# Patient Record
Sex: Male | Born: 1940 | Race: White | Hispanic: No | Marital: Married | Smoking: Former smoker
Health system: Southern US, Community
[De-identification: ages and names within clinical notes are randomized; demographics above are authoritative.]

## PROBLEM LIST (undated history)

## (undated) DIAGNOSIS — E119 Type 2 diabetes mellitus without complications: Secondary | ICD-10-CM

## (undated) DIAGNOSIS — I2510 Atherosclerotic heart disease of native coronary artery without angina pectoris: Secondary | ICD-10-CM

## (undated) DIAGNOSIS — C140 Malignant neoplasm of pharynx, unspecified: Secondary | ICD-10-CM

## (undated) DIAGNOSIS — I350 Nonrheumatic aortic (valve) stenosis: Secondary | ICD-10-CM

## (undated) DIAGNOSIS — I779 Disorder of arteries and arterioles, unspecified: Secondary | ICD-10-CM

## (undated) DIAGNOSIS — I4892 Unspecified atrial flutter: Secondary | ICD-10-CM

## (undated) DIAGNOSIS — I5022 Chronic systolic (congestive) heart failure: Secondary | ICD-10-CM

## (undated) DIAGNOSIS — I10 Essential (primary) hypertension: Secondary | ICD-10-CM

## (undated) DIAGNOSIS — E785 Hyperlipidemia, unspecified: Secondary | ICD-10-CM

## (undated) DIAGNOSIS — G8929 Other chronic pain: Secondary | ICD-10-CM

## (undated) DIAGNOSIS — I739 Peripheral vascular disease, unspecified: Secondary | ICD-10-CM

## (undated) DIAGNOSIS — I639 Cerebral infarction, unspecified: Secondary | ICD-10-CM

## (undated) DIAGNOSIS — M549 Dorsalgia, unspecified: Secondary | ICD-10-CM

## (undated) HISTORY — PX: TRACHEOSTOMY: SUR1362

## (undated) HISTORY — DX: Chronic systolic (congestive) heart failure: I50.22

## (undated) HISTORY — PX: BYPASS GRAFT: SHX909

## (undated) HISTORY — DX: Nonrheumatic aortic (valve) stenosis: I35.0

## (undated) HISTORY — PX: CARDIAC CATHETERIZATION: SHX172

---

## 2006-03-08 ENCOUNTER — Inpatient Hospital Stay (HOSPITAL_COMMUNITY): Admission: RE | Admit: 2006-03-08 | Discharge: 2006-03-13 | Payer: Self-pay | Admitting: Cardiothoracic Surgery

## 2006-04-07 ENCOUNTER — Encounter: Admission: RE | Admit: 2006-04-07 | Discharge: 2006-04-07 | Payer: Self-pay | Admitting: Cardiothoracic Surgery

## 2006-11-01 ENCOUNTER — Emergency Department: Payer: Self-pay | Admitting: General Practice

## 2010-01-12 ENCOUNTER — Ambulatory Visit: Payer: Self-pay | Admitting: Cardiovascular Disease

## 2010-05-03 ENCOUNTER — Inpatient Hospital Stay: Payer: Self-pay | Admitting: Internal Medicine

## 2011-03-26 ENCOUNTER — Other Ambulatory Visit: Payer: Self-pay | Admitting: *Deleted

## 2011-09-15 ENCOUNTER — Ambulatory Visit: Payer: Self-pay | Admitting: Cardiovascular Disease

## 2011-10-07 ENCOUNTER — Inpatient Hospital Stay: Payer: Self-pay | Admitting: Vascular Surgery

## 2012-06-26 ENCOUNTER — Emergency Department: Payer: Self-pay | Admitting: Emergency Medicine

## 2012-06-26 LAB — TROPONIN I: Troponin-I: <0.02 ng/mL

## 2012-06-26 LAB — CBC
MCH: 33.1 pg (ref 26.0–34.0)
Platelet: 179 10*3/uL (ref 150–440)
RBC: 3.6 10*6/uL — ABNORMAL LOW (ref 4.40–5.90)
RDW: 12.8 % (ref 11.5–14.5)
WBC: 4.6 10*3/uL (ref 3.8–10.6)

## 2012-06-26 LAB — URINALYSIS, COMPLETE
Bacteria: NONE SEEN
Glucose,UR: 150 mg/dL (ref 0–75)
Ketone: NEGATIVE
Nitrite: NEGATIVE
Protein: NEGATIVE
RBC,UR: NONE SEEN /HPF (ref 0–5)
Specific Gravity: 1.004 (ref 1.003–1.030)
WBC UR: <1 /HPF (ref 0–5)

## 2012-06-26 LAB — ETHANOL
Ethanol %: 0.205 % — ABNORMAL HIGH (ref 0.000–0.080)
Ethanol: 205 mg/dL

## 2012-06-26 LAB — COMPREHENSIVE METABOLIC PANEL
Anion Gap: 9 (ref 7–16)
Calcium, Total: 9.1 mg/dL (ref 8.5–10.1)
Chloride: 87 mmol/L — ABNORMAL LOW (ref 98–107)
Creatinine: 0.87 mg/dL (ref 0.60–1.30)
EGFR (African American): >60
Potassium: 3.4 mmol/L — ABNORMAL LOW (ref 3.5–5.1)
SGOT(AST): 81 U/L — ABNORMAL HIGH (ref 15–37)
SGPT (ALT): 55 U/L (ref 12–78)
Sodium: 124 mmol/L — ABNORMAL LOW (ref 136–145)

## 2012-06-26 LAB — DRUG SCREEN, URINE
Amphetamines, Ur Screen: NEGATIVE (ref ?–1000)
Cocaine Metabolite,Ur ~~LOC~~: NEGATIVE (ref ?–300)
MDMA (Ecstasy)Ur Screen: NEGATIVE (ref ?–500)
Opiate, Ur Screen: NEGATIVE (ref ?–300)
Tricyclic, Ur Screen: NEGATIVE (ref ?–1000)

## 2015-05-27 ENCOUNTER — Other Ambulatory Visit: Payer: Self-pay

## 2015-05-27 ENCOUNTER — Emergency Department: Payer: Medicare HMO

## 2015-05-27 ENCOUNTER — Encounter: Payer: Self-pay | Admitting: Emergency Medicine

## 2015-05-27 ENCOUNTER — Emergency Department
Admission: EM | Admit: 2015-05-27 | Discharge: 2015-05-27 | Disposition: A | Discharge status: Home / Self Care | Payer: Medicare HMO | Attending: Emergency Medicine | Admitting: Emergency Medicine

## 2015-05-27 DIAGNOSIS — I959 Hypotension, unspecified: Secondary | ICD-10-CM | POA: Diagnosis present

## 2015-05-27 DIAGNOSIS — E860 Dehydration: Secondary | ICD-10-CM

## 2015-05-27 DIAGNOSIS — E119 Type 2 diabetes mellitus without complications: Secondary | ICD-10-CM | POA: Diagnosis not present

## 2015-05-27 DIAGNOSIS — I10 Essential (primary) hypertension: Secondary | ICD-10-CM | POA: Insufficient documentation

## 2015-05-27 DIAGNOSIS — Z87891 Personal history of nicotine dependence: Secondary | ICD-10-CM | POA: Insufficient documentation

## 2015-05-27 HISTORY — DX: Essential (primary) hypertension: I10

## 2015-05-27 HISTORY — DX: Hyperlipidemia, unspecified: E78.5

## 2015-05-27 HISTORY — DX: Cerebral infarction, unspecified: I63.9

## 2015-05-27 HISTORY — DX: Other chronic pain: G89.29

## 2015-05-27 HISTORY — DX: Dorsalgia, unspecified: M54.9

## 2015-05-27 LAB — HEPATIC FUNCTION PANEL
ALBUMIN: 4.3 g/dL (ref 3.5–5.0)
ALT: 16 U/L — ABNORMAL LOW (ref 17–63)
AST: 25 U/L (ref 15–41)
Alkaline Phosphatase: 56 U/L (ref 38–126)
BILIRUBIN DIRECT: 0.1 mg/dL (ref 0.1–0.5)
BILIRUBIN INDIRECT: 0.6 mg/dL (ref 0.3–0.9)
Total Bilirubin: 0.7 mg/dL (ref 0.3–1.2)
Total Protein: 7.3 g/dL (ref 6.5–8.1)

## 2015-05-27 LAB — BASIC METABOLIC PANEL
ANION GAP: 14 (ref 5–15)
BUN: 17 mg/dL (ref 6–20)
CHLORIDE: 91 mmol/L — AB (ref 101–111)
CO2: 22 mmol/L (ref 22–32)
Calcium: 9.2 mg/dL (ref 8.9–10.3)
Creatinine, Ser: 1.77 mg/dL — ABNORMAL HIGH (ref 0.61–1.24)
GFR calc Af Amer: 42 mL/min — ABNORMAL LOW (ref >60)
GFR, EST NON AFRICAN AMERICAN: 36 mL/min — AB (ref >60)
GLUCOSE: 119 mg/dL — AB (ref 65–99)
POTASSIUM: 4.6 mmol/L (ref 3.5–5.1)
Sodium: 127 mmol/L — ABNORMAL LOW (ref 135–145)

## 2015-05-27 LAB — URINALYSIS COMPLETE WITH MICROSCOPIC (ARMC ONLY)
BACTERIA UA: NONE SEEN
Bilirubin Urine: NEGATIVE
Glucose, UA: NEGATIVE mg/dL
Hgb urine dipstick: NEGATIVE
Ketones, ur: NEGATIVE mg/dL
Leukocytes, UA: NEGATIVE
Nitrite: NEGATIVE
PROTEIN: NEGATIVE mg/dL
Specific Gravity, Urine: 1.012 (ref 1.005–1.030)
pH: 5 (ref 5.0–8.0)

## 2015-05-27 LAB — CBC
HEMATOCRIT: 35.7 % — AB (ref 40.0–52.0)
HEMOGLOBIN: 12.1 g/dL — AB (ref 13.0–18.0)
MCH: 32.4 pg (ref 26.0–34.0)
MCHC: 33.8 g/dL (ref 32.0–36.0)
MCV: 95.9 fL (ref 80.0–100.0)
PLATELETS: 199 10*3/uL (ref 150–440)
RBC: 3.72 MIL/uL — AB (ref 4.40–5.90)
RDW: 12.4 % (ref 11.5–14.5)
WBC: 11.4 10*3/uL — ABNORMAL HIGH (ref 3.8–10.6)

## 2015-05-27 LAB — LACTIC ACID, PLASMA
LACTIC ACID, VENOUS: 1.8 mmol/L (ref 0.5–2.0)
Lactic Acid, Venous: 3.1 mmol/L (ref 0.5–2.0)

## 2015-05-27 LAB — LIPASE, BLOOD: Lipase: 31 U/L (ref 22–51)

## 2015-05-27 LAB — TROPONIN I: Troponin I: <0.03 ng/mL (ref <0.031)

## 2015-05-27 MED ORDER — SODIUM CHLORIDE 0.9 % IV BOLUS (SEPSIS)
1000.0000 mL | Freq: Once | INTRAVENOUS | Status: AC
  Administered 2015-05-27: 1000 mL via INTRAVENOUS

## 2015-05-27 MED ORDER — ONDANSETRON 8 MG PO TBDP: 8.0000 mg | ORAL_TABLET | Freq: Three times a day (TID) | ORAL | Status: DC | PRN

## 2015-05-27 NOTE — ED Notes (Signed)
Dr. Joni Fears notified of elevated Lactic Acid level.

## 2015-05-27 NOTE — ED Provider Notes (Signed)
[facility] Emergency Department Provider Note  ____________________________________________  Time seen: 5:40 PM  I have reviewed the triage vital signs and the nursing notes.   HISTORY  Chief Complaint Hypotension    HPI Mitchell Huffman is a 74 y.o. male who is sent to the ED by his occupational therapy nurse who found the patient's blood pressure to be 80/43 at home. He was noted to be diaphoretic and pale at the time. He notes that up so last a few minutes and then he returned normal. The only symptom he can pinpoint is that he has not had an appetite and not been eating or drinking much over the last 2 days. Denies any chest pain shortness of breath fever chills headache nausea vomiting diarrhea abdominal pain back pain and neck pain vision changes numbness tingling or weakness.  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 He currently feels entirely at his baseline.     Past Medical History  Diagnosis Date  . Diabetes mellitus without complication   . Chronic back pain   . Hypertension   . Stroke   . Hyperlipidemia     There are no active problems to display for this patient.   Past Surgical History  Procedure Laterality Date  . Bypass graft    . Cardiac catheterization    . Tracheostomy      Current Outpatient Rx  Name  Route  Sig  Dispense  Refill  . ondansetron (ZOFRAN ODT) 8 MG disintegrating tablet   Oral   Take 1 tablet (8 mg total) by mouth every 8 (eight) hours as needed for nausea or vomiting.   20 tablet   0     Allergies Review of patient's allergies indicates no known allergies.  No family history on file.  Social History History  Substance Use Topics  . Smoking status: Former Research scientist (life sciences)  . Smokeless tobacco: Not on file  . Alcohol Use: Yes    Review of Systems  Constitutional: No fever or chills. No weight  changes Eyes:No blurry vision or double vision.  ENT: No sore throat. Cardiovascular: No chest pain. Respiratory: No dyspnea or cough. Gastrointestinal: Negative for abdominal pain, vomiting and diarrhea.  No BRBPR or melena. Loss of appetite Genitourinary: Negative for dysuria, urinary retention, bloody urine, or difficulty urinating. Musculoskeletal: Negative for back pain. No joint swelling or pain. Skin: Negative for rash. Neurological: Negative for headaches, focal weakness or numbness. Psychiatric:No anxiety or depression.   Endocrine:No hot/cold intolerance, changes in energy, or sleep difficulty.  10-point ROS otherwise negative.  ____________________________________________   PHYSICAL EXAM:  VITAL SIGNS: ED Triage Vitals  Enc Vitals Group     BP 05/27/15 1730 119/57 mmHg     Pulse Rate 05/27/15 1730 67     Resp 05/27/15 1730 20     Temp 05/27/15 1730 97.5 F (36.4 C)     Temp Source 05/27/15 1730 Oral     SpO2 05/27/15 1730 100 %     Weight 05/27/15 1730 170 lb (77.111 kg)     Height 05/27/15 1730 5\' 6"  (1.676 m)     Head Cir --      Peak Flow --      Pain Score --      Pain Loc --      Pain Edu? --      Excl. in [place]? --  Constitutional: Alert and oriented. Well appearing and in no distress. Eyes: No scleral icterus. No conjunctival pallor. PERRL. EOMI ENT   Head: Normocephalic and atraumatic.   Nose: No congestion/rhinnorhea. No septal hematoma   Mouth/Throat: Dry mucous membranes, no pharyngeal erythema. No peritonsillar mass. No uvula shift.   Neck: No stridor. No SubQ emphysema. No meningismus. Tracheostomy intact Hematological/Lymphatic/Immunilogical: No cervical lymphadenopathy. Cardiovascular: RRR. Normal and symmetric distal pulses are present in all extremities. No murmurs, rubs, or gallops. Respiratory: Normal respiratory effort without tachypnea nor retractions. Breath sounds are clear and equal bilaterally. No  wheezes/rales/rhonchi. Gastrointestinal: Soft and nontender. No distention. There is no CVA tenderness.  No rebound, rigidity, or guarding. Genitourinary: deferred Musculoskeletal: Nontender with normal range of motion in all extremities. No joint effusions.  No lower extremity tenderness.  No edema. Neurologic:   Unable to speak due to tracheostomy.  CN 2-10 normal. Motor grossly intact. No pronator drift.  Normal gait. No gross focal neurologic deficits are appreciated.  Skin:  Skin is warm, dry and intact. No rash noted.  No petechiae, purpura, or bullae. Psychiatric: Mood and affect are normal. Speech and behavior are normal. Patient exhibits appropriate insight and judgment.  ____________________________________________    LABS (pertinent positives/negatives) (all labs ordered are listed, but only abnormal results are displayed) Labs Reviewed  BASIC METABOLIC PANEL - Abnormal; Notable for the following:    Sodium 127 (*)    Chloride 91 (*)    Glucose, Bld 119 (*)    Creatinine, Ser 1.77 (*)    GFR calc non Af Amer 36 (*)    GFR calc Af Amer 42 (*)    All other components within normal limits  CBC - Abnormal; Notable for the following:    WBC 11.4 (*)    RBC 3.72 (*)    Hemoglobin 12.1 (*)    HCT 35.7 (*)    All other components within normal limits  HEPATIC FUNCTION PANEL - Abnormal; Notable for the following:    ALT 16 (*)    All other components within normal limits  LACTIC ACID, PLASMA - Abnormal; Notable for the following:    Lactic Acid, Venous 3.1 (*)    All other components within normal limits  URINALYSIS COMPLETEWITH MICROSCOPIC ([facility] ONLY) - Abnormal; Notable for the following:    Color, Urine YELLOW (*)    APPearance CLEAR (*)    Squamous Epithelial / LPF 0-5 (*)    All other components within normal limits  CULTURE, BLOOD (ROUTINE X 2)  CULTURE, BLOOD (ROUTINE X 2)  URINE CULTURE  LIPASE, BLOOD  TROPONIN I  LACTIC ACID, PLASMA  CBG MONITORING, ED    ____________________________________________   EKG  Interpreted by me Normal sinus rhythm rate of 83, normal axis and intervals. Small R-wave in V2 with downsloping inverted T-wave. There is also T-wave inversion in aVL. This is unchanged compared to prior EKG on 06/26/2012  Due to findings in V2 on initial EKG repeat EKGs were performed using right-sided and posterior leads. Right-sided EKG interpreted by me. Right-sided leads were placed on V4 through V6. EKG is unchanged with expected right-sided morphologies in V4 through V6 without ST elevation.  Posterior lead EKG interpreted by me Posterior leads were placed using the V4 and V5 cables corresponding to positions V8 and V9 No changes in EKG, posterior leads labeled as V4 and V5 have expected morphologies without any ST elevation.  ____________________________________________    RADIOLOGY  Chest x-ray unremarkable  ____________________________________________   PROCEDURES  CRITICAL CARE Performed by: Joni Fears, Drexel Ivey   Total critical care time: 35 minutes  Critical care time was exclusive of separately billable procedures and treating other patients.  Critical care was necessary to treat or prevent imminent or life-threatening deterioration.  Critical care was time spent personally by me on the following activities: development of treatment plan with patient and/or surrogate as well as nursing, discussions with consultants, evaluation of patient's response to treatment, examination of patient, obtaining history from patient or surrogate, ordering and performing treatments and interventions, ordering and review of laboratory studies, ordering and review of radiographic studies, pulse oximetry and re-evaluation of patient's condition.  ____________________________________________   INITIAL IMPRESSION / ASSESSMENT AND PLAN / ED COURSE  Pertinent labs & imaging results that were available during my care of the patient  were reviewed by me and considered in my medical decision making (see chart for details).  Patient currently and apparent baseline no acute distress nontoxic. Vital signs are currently normal, and EKG is unchanged. Due to the documented episode of hypotension, will give IV fluid bolus as well as check chest x-ray urinalysis labs including lactate and blood culture. We'll hold off and empiric antibiotics at this time since the patient has no acute symptoms and normal vitals. If workup is unremarkable we will discharge the patient home to follow up with primary care. ----------------------------------------- 7:57 PM on 05/27/2015 -----------------------------------------  Initial workup unremarkable except for acute renal insufficiency with a creatinine of 1.77 with a previous baseline of 0.87. He has has chronic hyponatremia that is stable, and a elevated lactate of 3.1. I discussed the son is with the patient which appear to be due to dehydration and poor by mouth intake over last couple days. He states that he will drink extra water be sure to stay hydrated. He refuses to stay in the hospital, so we'll continue IV fluids and check a repeat lactate at a 2 hour interval to ensure that he is improving with hydration. Chest x-ray and urinalysis are unremarkable, the patient remains completely asymptomatic and at baseline with normal vital signs throughout his stay in the ED. There is no evidence of sepsis or any focal ischemia such as mesenteric ischemia, dissection, or arterial occlusion. ----------------------------------------- 9:24 PM on 05/27/2015 -----------------------------------------  Repeat lactate 1.8, significant normalization with rehydration. Remains medically stable and asymptomatic. We'll discharge home.  ____________________________________________   FINAL CLINICAL IMPRESSION(S) / ED DIAGNOSES  Final diagnoses:  Hypotension, unspecified hypotension type  Dehydration       Carrie Mew, MD 05/27/15 2124

## 2015-05-27 NOTE — ED Notes (Signed)
Pt comes in today with the complaint of low blood pressure.  His occupational therapy nurse went to his house today and checked his blood pressure which was 80/43, he was diaphoretic, and pale.  Patient denies any pain at this time.

## 2015-05-27 NOTE — Discharge Instructions (Signed)
Dehydration Dehydration is when you lose more fluids from the body than you take in. Vital organs such as the kidneys, brain, and heart cannot function without a proper amount of fluids and salt. Any loss of fluids from the body can cause dehydration.  Older adults are at a higher risk of dehydration than younger adults. As we age, our bodies are less able to conserve water and do not respond to temperature changes as well. Also, older adults do not become thirsty as easily or quickly. Because of this, older adults often do not realize they need to increase fluids to avoid dehydration.  CAUSES   Vomiting.  Diarrhea.  Excessive sweating.  Excessive urination.  Fever.  Certain medicines, such as blood pressure medicines called diuretics.  Poorly controlled blood sugars. SIGNS AND SYMPTOMS  Mild dehydration:  Thirst.  Dry lips.  Slightly dry mouth. Moderate dehydration:  Very dry mouth.  Sunken eyes.  Skin does not bounce back quickly when lightly pinched and released.  Dark urine and decreased urine production.  Decreased tear production.  Headache. Severe dehydration:  Very dry mouth.  Extreme thirst.  Rapid, weak pulse (more than 100 beats per minute at rest).  Cold hands and feet.  Not able to sweat in spite of heat.  Rapid breathing.  Blue lips.  Confusion and lethargy.  Difficulty being awakened.  Minimal urine production.  No tears. DIAGNOSIS  Your health care provider will diagnose dehydration based on your symptoms and your exam. Blood and urine tests will help confirm the diagnosis. The diagnostic evaluation should also identify the cause of dehydration. TREATMENT  Treatment of mild or moderate dehydration can often be done at home by increasing the amount of fluids that you drink. It is best to drink small amounts of fluid more often. Drinking too much at one time can make vomiting worse. Severe dehydration needs to be treated at the hospital.  You may be given IV fluids that contain water and electrolytes. HOME CARE INSTRUCTIONS   Ask your health care provider about specific rehydration instructions.  Drink enough fluids to keep your urine clear or pale yellow.  Drink small amounts frequently if you have nausea and vomiting.  Eat as you normally do.  Avoid:  Foods or drinks high in sugar.  Carbonated drinks.  Juice.  Extremely hot or cold fluids.  Drinks with caffeine.  Fatty, greasy foods.  Alcohol.  Tobacco.  Overeating.  Gelatin desserts.  Wash your hands well to avoid spreading bacteria and viruses.  Only take over-the-counter or prescription medicines for pain, discomfort, or fever as directed by your health care provider.  Ask your health care provider if you should continue all prescribed and over-the-counter medicines.  Keep all follow-up appointments with your health care provider. SEEK MEDICAL CARE IF:  You have abdominal pain, and it increases or stays in one area (localizes).  You have a rash, stiff neck, or severe headache.  You are irritable, sleepy, or difficult to awaken.  You are weak, dizzy, or extremely thirsty.  You have a fever. SEEK IMMEDIATE MEDICAL CARE IF:   You are unable to keep fluids down, or you get worse despite treatment.  You have frequent episodes of vomiting or diarrhea.  You have blood or green matter (bile) in your vomit.  You have blood in your stool, or your stool looks black and tarry.  You have not urinated in 6-8 hours, or you have only urinated a small amount of very dark urine.  You faint. MAKE SURE YOU:   Understand these instructions.  Will watch your condition.  Will get help right away if you are not doing well or get worse. Document Released: 01/08/2004 Document Revised: 10/23/2013 Document Reviewed: 06/25/2013 [facility] Patient Information 2015 [place], [place]. This information is not intended to replace advice given to you by your  health care provider. Make sure you discuss any questions you have with your health care provider.  Hypotension As your heart beats, it forces blood through your arteries. This force is your blood pressure. If your blood pressure is too low for you to go about your normal activities or to support the organs of your body, you have hypotension. Hypotension is also referred to as low blood pressure. When your blood pressure becomes too low, you may not get enough blood to your brain. As a result, you may feel weak, feel lightheaded, or develop a rapid heart rate. In a more severe case, you may faint. CAUSES Various conditions can cause hypotension. These include:  Blood loss.  Dehydration.  Heart or endocrine problems.  Pregnancy.  Severe infection.  Not having a well-balanced diet filled with needed nutrients.  Severe allergic reactions (anaphylaxis). Some medicines, such as blood pressure medicine or water pills (diuretics), may lower your blood pressure below normal. Sometimes taking too much medicine or taking medicine not as directed can cause hypotension. TREATMENT  Hospitalization is sometimes required for hypotension if fluid or blood replacement is needed, if time is needed for medicines to wear off, or if further monitoring is needed. Treatment might include changing your diet, changing your medicines (including medicines aimed at raising your blood pressure), and use of support stockings. HOME CARE INSTRUCTIONS   Drink enough fluids to keep your urine clear or pale yellow.  Take your medicines as directed by your health care provider.  Get up slowly from reclining or sitting positions. This gives your blood pressure a chance to adjust.  Wear support stockings as directed by your health care provider.  Maintain a healthy diet by including nutritious food, such as fruits, vegetables, nuts, whole grains, and lean meats. SEEK MEDICAL CARE IF:  You have vomiting or  diarrhea.  You have a fever for more than 2-3 days.  You feel more thirsty than usual.  You feel weak and tired. SEEK IMMEDIATE MEDICAL CARE IF:   You have chest pain or a fast or irregular heartbeat.  You have a loss of feeling in some part of your body, or you lose movement in your arms or legs.  You have trouble speaking.  You become sweaty or feel lightheaded.  You faint. MAKE SURE YOU:   Understand these instructions.  Will watch your condition.  Will get help right away if you are not doing well or get worse. Document Released: 10/18/2005 Document Revised: 08/08/2013 Document Reviewed: 04/20/2013 [facility] Patient Information 2015 [place], [place]. This information is not intended to replace advice given to you by your health care provider. Make sure you discuss any questions you have with your health care provider.

## 2015-05-27 NOTE — ED Notes (Signed)
MD at bedside. 

## 2015-05-28 ENCOUNTER — Other Ambulatory Visit: Payer: Self-pay

## 2015-05-28 ENCOUNTER — Emergency Department: Payer: Medicare HMO

## 2015-05-28 ENCOUNTER — Emergency Department
Admission: EM | Admit: 2015-05-28 | Discharge: 2015-05-28 | Disposition: A | Discharge status: Home / Self Care | Payer: Medicare HMO | Attending: Emergency Medicine | Admitting: Emergency Medicine

## 2015-05-28 DIAGNOSIS — I10 Essential (primary) hypertension: Secondary | ICD-10-CM | POA: Diagnosis not present

## 2015-05-28 DIAGNOSIS — Z87891 Personal history of nicotine dependence: Secondary | ICD-10-CM | POA: Insufficient documentation

## 2015-05-28 DIAGNOSIS — R55 Syncope and collapse: Secondary | ICD-10-CM | POA: Diagnosis present

## 2015-05-28 DIAGNOSIS — Z7982 Long term (current) use of aspirin: Secondary | ICD-10-CM | POA: Diagnosis not present

## 2015-05-28 DIAGNOSIS — Z791 Long term (current) use of non-steroidal anti-inflammatories (NSAID): Secondary | ICD-10-CM | POA: Insufficient documentation

## 2015-05-28 DIAGNOSIS — Z79899 Other long term (current) drug therapy: Secondary | ICD-10-CM | POA: Insufficient documentation

## 2015-05-28 DIAGNOSIS — Z7902 Long term (current) use of antithrombotics/antiplatelets: Secondary | ICD-10-CM | POA: Diagnosis not present

## 2015-05-28 DIAGNOSIS — Z7952 Long term (current) use of systemic steroids: Secondary | ICD-10-CM | POA: Insufficient documentation

## 2015-05-28 DIAGNOSIS — E119 Type 2 diabetes mellitus without complications: Secondary | ICD-10-CM | POA: Insufficient documentation

## 2015-05-28 LAB — URINALYSIS COMPLETE WITH MICROSCOPIC (ARMC ONLY)
Bacteria, UA: NONE SEEN
Bilirubin Urine: NEGATIVE
Glucose, UA: NEGATIVE mg/dL
Hgb urine dipstick: NEGATIVE
KETONES UR: NEGATIVE mg/dL
LEUKOCYTES UA: NEGATIVE
Nitrite: NEGATIVE
PROTEIN: NEGATIVE mg/dL
RBC / HPF: NONE SEEN RBC/hpf (ref 0–5)
Specific Gravity, Urine: 1.008 (ref 1.005–1.030)
Squamous Epithelial / LPF: NONE SEEN
pH: 5 (ref 5.0–8.0)

## 2015-05-28 LAB — COMPREHENSIVE METABOLIC PANEL
ALT: 12 U/L — ABNORMAL LOW (ref 17–63)
ANION GAP: 7 (ref 5–15)
AST: 15 U/L (ref 15–41)
Albumin: 3.6 g/dL (ref 3.5–5.0)
Alkaline Phosphatase: 51 U/L (ref 38–126)
BUN: 16 mg/dL (ref 6–20)
CALCIUM: 8.6 mg/dL — AB (ref 8.9–10.3)
CO2: 23 mmol/L (ref 22–32)
Chloride: 101 mmol/L (ref 101–111)
Creatinine, Ser: 1.32 mg/dL — ABNORMAL HIGH (ref 0.61–1.24)
GFR calc Af Amer: >60 mL/min (ref >60)
GFR, EST NON AFRICAN AMERICAN: 52 mL/min — AB (ref >60)
Glucose, Bld: 114 mg/dL — ABNORMAL HIGH (ref 65–99)
Potassium: 4.4 mmol/L (ref 3.5–5.1)
Sodium: 131 mmol/L — ABNORMAL LOW (ref 135–145)
Total Bilirubin: 0.7 mg/dL (ref 0.3–1.2)
Total Protein: 6.4 g/dL — ABNORMAL LOW (ref 6.5–8.1)

## 2015-05-28 LAB — CBC
HCT: 32.1 % — ABNORMAL LOW (ref 40.0–52.0)
Hemoglobin: 11 g/dL — ABNORMAL LOW (ref 13.0–18.0)
MCH: 33.1 pg (ref 26.0–34.0)
MCHC: 34.3 g/dL (ref 32.0–36.0)
MCV: 96.5 fL (ref 80.0–100.0)
Platelets: 153 10*3/uL (ref 150–440)
RBC: 3.33 MIL/uL — ABNORMAL LOW (ref 4.40–5.90)
RDW: 12.3 % (ref 11.5–14.5)
WBC: 9.9 10*3/uL (ref 3.8–10.6)

## 2015-05-28 LAB — URINE CULTURE

## 2015-05-28 LAB — LACTIC ACID, PLASMA: Lactic Acid, Venous: 0.7 mmol/L (ref 0.5–2.0)

## 2015-05-28 LAB — TROPONIN I: Troponin I: <0.03 ng/mL (ref <0.031)

## 2015-05-28 MED ORDER — SODIUM CHLORIDE 0.9 % IV SOLN
1000.0000 mL | Freq: Once | INTRAVENOUS | Status: AC
  Administered 2015-05-28: 1000 mL via INTRAVENOUS

## 2015-05-28 NOTE — Discharge Instructions (Signed)
Syncope °Syncope means a person passes out (faints). The person usually wakes up in less than 5 minutes. It is important to seek medical care for syncope. °HOME CARE °· Have someone stay with you until you feel normal. °· Do not drive, use machines, or play sports until your doctor says it is okay. °· Keep all doctor visits as told. °· Lie down when you feel like you might pass out. Take deep breaths. Wait until you feel normal before standing up. °· Drink enough fluids to keep your pee (urine) clear or pale yellow. °· If you take blood pressure or heart medicine, get up slowly. Take several minutes to sit and then stand. °GET HELP RIGHT AWAY IF:  °· You have a severe headache. °· You have pain in the chest, belly (abdomen), or back. °· You are bleeding from the mouth or butt (rectum). °· You have black or tarry poop (stool). °· You have an irregular or very fast heartbeat. °· You have pain with breathing. °· You keep passing out, or you have shaking (seizures) when you pass out. °· You pass out when sitting or lying down. °· You feel confused. °· You have trouble walking. °· You have severe weakness. °· You have vision problems. °If you fainted, call your local emergency services (911 in [place] Do not drive yourself to the hospital. °MAKE SURE YOU:  °· Understand these instructions. °· Will watch your condition. °· Will get help right away if you are not doing well or get worse. °Document Released: 04/05/2008 Document Revised: 04/18/2012 Document Reviewed: 12/17/2011 °[employer]® Patient Information ©2015 [employer]. This information is not intended to replace advice given to you by your health care provider. Make sure you discuss any questions you have with your health care provider. ° °

## 2015-05-28 NOTE — ED Notes (Addendum)
Pt presents to ED with c/o hypotension. Pt was said to have been sitting at dinner and "slumped over" per his family. Pt seen in this ED yesterday and dx with hypotension and dehydration. Pt alert and calm at this time with no increased work of breathing or acute distress noted at this time. BP 69/39 with EMS with HR of 60

## 2015-05-28 NOTE — ED Provider Notes (Signed)
[facility] Emergency Department Provider Note  ____________________________________________  Time seen: On arrival  I have reviewed the triage vital signs and the nursing notes.   HISTORY  Chief Complaint Loss of Consciousness and Hypotension    HPI Mitchell Huffman is a 74 y.o. male who apparently had a near syncopal episode today. Reportedly patient was sitting in his easy chairand was difficult to arouse. Son reports he was sweaty. When he was aroused he was answering questions appropriately and moving all extremities. This is similar to what happened yesterday when he was seen in the emergency department. He feels well currently and is acting normally per family. He denies chest pain. No fevers no chills. No shortness of breath. He wants to go home     Past Medical History  Diagnosis Date  . Diabetes mellitus without complication   . Chronic back pain   . Hypertension   . Stroke   . Hyperlipidemia     There are no active problems to display for this patient.   Past Surgical History  Procedure Laterality Date  . Bypass graft    . Cardiac catheterization    . Tracheostomy      Current Outpatient Rx  Name  Route  Sig  Dispense  Refill  . aspirin 81 MG tablet   Oral   Take 1 tablet by mouth daily.         . carvedilol (COREG) 12.5 MG tablet   Oral   Take 1 tablet by mouth every morning.         . cholecalciferol (VITAMIN D) 1000 UNITS tablet   Oral   Take 2,000 Units by mouth daily.         . clopidogrel (PLAVIX) 75 MG tablet   Oral   Take 1 tablet by mouth daily.         . CRESTOR 40 MG tablet   Oral   Take 1 tablet by mouth daily.           Dispense as written.   Marland Kitchen levothyroxine (SYNTHROID, LEVOTHROID) 150 MCG tablet   Oral   Take 1 tablet by mouth daily.         Marland Kitchen losartan (COZAAR) 50 MG tablet   Oral   Take 1 tablet by mouth daily.         . metFORMIN (GLUCOPHAGE) 850 MG tablet   Oral   Take 1 tablet  by mouth 2 (two) times daily.         . Multiple Vitamins-Minerals (MULTIVITAMIN WITH MINERALS) tablet   Oral   Take 1 tablet by mouth daily.         . naproxen (NAPROSYN) 500 MG tablet   Oral   Take 1 tablet by mouth 2 (two) times daily.         . Omega-3 Fatty Acids (FISH OIL) 1200 MG CAPS   Oral   Take 1 capsule by mouth daily.         Marland Kitchen PAIN RELIEF MAXIMUM STRENGTH 16 % GEL   Topical   Apply 1 application topically daily as needed.           Dispense as written.   Marland Kitchen spironolactone (ALDACTONE) 25 MG tablet   Oral   Take 0.5 tablets by mouth daily.         Marland Kitchen tiZANidine (ZANAFLEX) 2 MG tablet   Oral   Take 1 tablet by mouth 4 (four) times daily.         Marland Kitchen  ondansetron (ZOFRAN ODT) 8 MG disintegrating tablet   Oral   Take 1 tablet (8 mg total) by mouth every 8 (eight) hours as needed for nausea or vomiting. Patient not taking: Reported on 05/28/2015   20 tablet   0     Allergies Review of patient's allergies indicates no known allergies.  No family history on file.  Social History History  Substance Use Topics  . Smoking status: Former Research scientist (life sciences)  . Smokeless tobacco: Not on file  . Alcohol Use: Yes    Review of Systems  Constitutional: Negative for fever. Eyes: Negative for visual changes. ENT: Negative for sore throat Cardiovascular: Negative for chest pain. Respiratory: Negative for shortness of breath. Gastrointestinal: Negative for abdominal pain, vomiting and diarrhea. Genitourinary: Negative for dysuria. Musculoskeletal: Negative for back pain. Skin: Negative for rash. Neurological: Negative for headaches or focal weakness Psychiatric: No anxiety   ____________________________________________   PHYSICAL EXAM:  VITAL SIGNS: ED Triage Vitals  Enc Vitals Group     BP 05/28/15 2021 95/53 mmHg     Pulse Rate 05/28/15 2021 68     Resp 05/28/15 2021 22     Temp --      Temp src --      SpO2 05/28/15 2021 98 %     Weight --       Height --      Head Cir --      Peak Flow --      Pain Score --      Pain Loc --      Pain Edu? --      Excl. in [place]? --     Blood pressure on my exam is 150/70  Constitutional: Alert and oriented. Well appearing and in no distress. Eyes: Conjunctivae are normal.  ENT   Head: Normocephalic and atraumatic.   Mouth/Throat: Mucous membranes are moist. Cardiovascular: Normal rate, regular rhythm. Normal and symmetric distal pulses are present in all extremities. No murmurs, rubs, or gallops. Respiratory: Normal respiratory effort without tachypnea nor retractions. Breath sounds are clear and equal bilaterally.  Gastrointestinal: Soft and non-tender in all quadrants. No distention. There is no CVA tenderness. Genitourinary: deferred Musculoskeletal: Nontender with normal range of motion in all extremities. No lower extremity tenderness nor edema. Neurologic:  . No gross focal neurologic deficits are appreciated. Skin:  Skin is warm, dry and intact. No rash noted. Psychiatric: Mood and affect are normal. Patient exhibits appropriate insight and judgment.  ____________________________________________    LABS (pertinent positives/negatives)  Labs Reviewed  CBC - Abnormal; Notable for the following:    RBC 3.33 (*)    Hemoglobin 11.0 (*)    HCT 32.1 (*)    All other components within normal limits  COMPREHENSIVE METABOLIC PANEL - Abnormal; Notable for the following:    Sodium 131 (*)    Glucose, Bld 114 (*)    Creatinine, Ser 1.32 (*)    Calcium 8.6 (*)    Total Protein 6.4 (*)    ALT 12 (*)    GFR calc non Af Amer 52 (*)    All other components within normal limits  URINALYSIS COMPLETEWITH MICROSCOPIC ([facility] ONLY) - Abnormal; Notable for the following:    Color, Urine YELLOW (*)    APPearance CLEAR (*)    All other components within normal limits  TROPONIN I  LACTIC ACID, PLASMA  LACTIC ACID, PLASMA    ____________________________________________   EKG  ED ECG  REPORT I, Lavonia Drafts, the attending physician,  personally viewed and interpreted this ECG.  Date: 05/28/2015 EKG Time: 8:25 PM Rate: 62 Rhythm: normal sinus rhythm QRS Axis: normal Intervals: normal ST/T Wave abnormalities: normal Conduction Disutrbances: none Narrative Interpretation: unremarkable   ____________________________________________    RADIOLOGY I have personally reviewed any xrays that were ordered on this patient: Mild cardiomegaly on chest x-ray  ____________________________________________   PROCEDURES  Procedure(s) performed: none  Critical Care performed: none  ____________________________________________   INITIAL IMPRESSION / ASSESSMENT AND PLAN / ED COURSE  Pertinent labs & imaging results that were available during my care of the patient were reviewed by me and considered in my medical decision making (see chart for details).  Patient overall well-appearing. He was seen in the emergency department yesterday and insisted on discharge. He is doing the same today. He has no interest in staying in the hospital he does have decisional capacity. He agrees to stay in undergo another workup.   Patient's labs are unremarkable and in fact improved from yesterday. His blood pressure has been normal during his entire ED stay. His chest x-ray is normal. His EKG is unremarkable.  Again he refuses to stay in the hospital.   ____________________________________________   FINAL CLINICAL IMPRESSION(S) / ED DIAGNOSES  Final diagnoses:  Near syncope     Lavonia Drafts, MD 05/29/15 [phone]

## 2015-05-29 ENCOUNTER — Ambulatory Visit: Payer: Self-pay | Admitting: Podiatry

## 2015-06-01 LAB — CULTURE, BLOOD (ROUTINE X 2)
CULTURE: NO GROWTH
Culture: NO GROWTH

## 2015-06-24 ENCOUNTER — Ambulatory Visit (INDEPENDENT_AMBULATORY_CARE_PROVIDER_SITE_OTHER): Payer: Medicare HMO | Admitting: Podiatry

## 2015-06-24 ENCOUNTER — Encounter: Payer: Self-pay | Admitting: Podiatry

## 2015-06-24 VITALS — BP 131/73 | HR 70 | Resp 18

## 2015-06-24 DIAGNOSIS — M79676 Pain in unspecified toe(s): Secondary | ICD-10-CM | POA: Diagnosis not present

## 2015-06-24 DIAGNOSIS — B351 Tinea unguium: Secondary | ICD-10-CM | POA: Diagnosis not present

## 2015-06-24 NOTE — Progress Notes (Signed)
   Subjective:    Patient ID: WILFERD RITSON, male    DOB: 1941-03-14, 74 y.o.   MRN: [id number]  HPI  74 year old nonverbal male presents the office today for complaints of thick, painful, elongated toenails. He presents today with his wife. He denies any drainage or redness from around the toenails. He is diabetic however he denies any history of ulceration his last blood sugar was approximately 120. His wife denies any tingling or numbness. No other complaints at this time.   Review of Systems  Respiratory:       Trach  Cardiovascular:       High low blood pressure    Endocrine:       Thyroid Diabetes    Genitourinary:       Bladder problems  Musculoskeletal:       Osteoarthritis  Rheumatoid  Arthritis Osteoporosis Swelling of the feet and legs    Psychiatric/Behavioral: The patient is nervous/anxious.   All other systems reviewed and are negative.      Objective:   Physical Exam Awake, alert, NAD , nonverbal DP/PT pulses palpable, CRT less than 3 seconds Protective sensation appears to be intact with Derrel Nip monofilament, Achilles tendon reflex intact. Nails are significantly hypertrophic, dystrophic, brittle, discolored, elongated x 10. There is no surrounding erythema or drainage. There is tenderness to palpation overlying nails 1-5 bilaterally. No open lesions or pre-ulcerative lesions. No other areas of tenderness to bilateral lower extremities. MMT 4/5, ROM WNL No pain with calf compression, swelling, warmth, erythema.      Assessment & Plan:   74 year old male with symptomatic onychomycosis -Treatment options discussed including all alternatives, risks, and complications -Nail sharply debrided 10 without complication/bleeding -discussed the importance of daily foot inspection. -Follow-up in 3 months or sooner if any problems arise. In the meantime, encouraged to call the office with any questions, concerns, change in symptoms. Follow-up with PCP  for other issues mentioned in the ROS.  Celesta Gentile, DPM

## 2015-06-25 ENCOUNTER — Encounter: Payer: Self-pay | Admitting: Podiatry

## 2015-09-23 ENCOUNTER — Ambulatory Visit: Payer: Medicare HMO | Admitting: Podiatry

## 2016-03-30 ENCOUNTER — Emergency Department: Payer: Medicare HMO

## 2016-03-30 ENCOUNTER — Encounter: Payer: Self-pay | Admitting: Emergency Medicine

## 2016-03-30 ENCOUNTER — Inpatient Hospital Stay
Admission: EM | Admit: 2016-03-30 | Discharge: 2016-04-06 | DRG: 309 | Disposition: A | Discharge status: Home Health | Payer: Medicare HMO | Attending: Internal Medicine | Admitting: Internal Medicine

## 2016-03-30 DIAGNOSIS — I4891 Unspecified atrial fibrillation: Secondary | ICD-10-CM | POA: Diagnosis present

## 2016-03-30 DIAGNOSIS — E871 Hypo-osmolality and hyponatremia: Secondary | ICD-10-CM | POA: Diagnosis present

## 2016-03-30 DIAGNOSIS — I483 Typical atrial flutter: Secondary | ICD-10-CM | POA: Diagnosis not present

## 2016-03-30 DIAGNOSIS — I739 Peripheral vascular disease, unspecified: Secondary | ICD-10-CM | POA: Diagnosis present

## 2016-03-30 DIAGNOSIS — Z951 Presence of aortocoronary bypass graft: Secondary | ICD-10-CM

## 2016-03-30 DIAGNOSIS — I350 Nonrheumatic aortic (valve) stenosis: Secondary | ICD-10-CM | POA: Diagnosis present

## 2016-03-30 DIAGNOSIS — Z7902 Long term (current) use of antithrombotics/antiplatelets: Secondary | ICD-10-CM

## 2016-03-30 DIAGNOSIS — E860 Dehydration: Secondary | ICD-10-CM | POA: Diagnosis present

## 2016-03-30 DIAGNOSIS — I4892 Unspecified atrial flutter: Secondary | ICD-10-CM | POA: Diagnosis present

## 2016-03-30 DIAGNOSIS — I10 Essential (primary) hypertension: Secondary | ICD-10-CM | POA: Diagnosis not present

## 2016-03-30 DIAGNOSIS — Z79899 Other long term (current) drug therapy: Secondary | ICD-10-CM

## 2016-03-30 DIAGNOSIS — Z8673 Personal history of transient ischemic attack (TIA), and cerebral infarction without residual deficits: Secondary | ICD-10-CM | POA: Diagnosis not present

## 2016-03-30 DIAGNOSIS — Z87891 Personal history of nicotine dependence: Secondary | ICD-10-CM

## 2016-03-30 DIAGNOSIS — R7989 Other specified abnormal findings of blood chemistry: Secondary | ICD-10-CM | POA: Diagnosis present

## 2016-03-30 DIAGNOSIS — Z7982 Long term (current) use of aspirin: Secondary | ICD-10-CM | POA: Diagnosis not present

## 2016-03-30 DIAGNOSIS — E039 Hypothyroidism, unspecified: Secondary | ICD-10-CM | POA: Diagnosis present

## 2016-03-30 DIAGNOSIS — I2510 Atherosclerotic heart disease of native coronary artery without angina pectoris: Secondary | ICD-10-CM | POA: Diagnosis present

## 2016-03-30 DIAGNOSIS — E872 Acidosis: Secondary | ICD-10-CM | POA: Diagnosis present

## 2016-03-30 DIAGNOSIS — G8929 Other chronic pain: Secondary | ICD-10-CM | POA: Diagnosis present

## 2016-03-30 DIAGNOSIS — M549 Dorsalgia, unspecified: Secondary | ICD-10-CM | POA: Diagnosis present

## 2016-03-30 DIAGNOSIS — E785 Hyperlipidemia, unspecified: Secondary | ICD-10-CM | POA: Diagnosis present

## 2016-03-30 DIAGNOSIS — I5022 Chronic systolic (congestive) heart failure: Secondary | ICD-10-CM | POA: Diagnosis present

## 2016-03-30 DIAGNOSIS — Z85819 Personal history of malignant neoplasm of unspecified site of lip, oral cavity, and pharynx: Secondary | ICD-10-CM

## 2016-03-30 DIAGNOSIS — Z8249 Family history of ischemic heart disease and other diseases of the circulatory system: Secondary | ICD-10-CM | POA: Diagnosis not present

## 2016-03-30 DIAGNOSIS — Z8521 Personal history of malignant neoplasm of larynx: Secondary | ICD-10-CM

## 2016-03-30 DIAGNOSIS — Z7984 Long term (current) use of oral hypoglycemic drugs: Secondary | ICD-10-CM | POA: Diagnosis not present

## 2016-03-30 DIAGNOSIS — I110 Hypertensive heart disease with heart failure: Secondary | ICD-10-CM | POA: Diagnosis present

## 2016-03-30 DIAGNOSIS — I959 Hypotension, unspecified: Secondary | ICD-10-CM | POA: Diagnosis present

## 2016-03-30 DIAGNOSIS — C140 Malignant neoplasm of pharynx, unspecified: Secondary | ICD-10-CM | POA: Diagnosis present

## 2016-03-30 DIAGNOSIS — E119 Type 2 diabetes mellitus without complications: Secondary | ICD-10-CM | POA: Diagnosis present

## 2016-03-30 HISTORY — DX: Peripheral vascular disease, unspecified: I73.9

## 2016-03-30 HISTORY — DX: Disorder of arteries and arterioles, unspecified: I77.9

## 2016-03-30 HISTORY — DX: Malignant neoplasm of pharynx, unspecified: C14.0

## 2016-03-30 HISTORY — DX: Atherosclerotic heart disease of native coronary artery without angina pectoris: I25.10

## 2016-03-30 HISTORY — DX: Type 2 diabetes mellitus without complications: E11.9

## 2016-03-30 HISTORY — DX: Unspecified atrial flutter: I48.92

## 2016-03-30 LAB — CBC WITH DIFFERENTIAL/PLATELET
BASOS PCT: 0 %
Basophils Absolute: 0 10*3/uL (ref 0–0.1)
Eosinophils Absolute: 0.1 10*3/uL (ref 0–0.7)
Eosinophils Relative: 1 %
HEMATOCRIT: 36.3 % — AB (ref 40.0–52.0)
Hemoglobin: 12.3 g/dL — ABNORMAL LOW (ref 13.0–18.0)
Lymphocytes Relative: 38 %
Lymphs Abs: 2.4 10*3/uL (ref 1.0–3.6)
MCH: 32.5 pg (ref 26.0–34.0)
MCHC: 33.9 g/dL (ref 32.0–36.0)
MCV: 95.9 fL (ref 80.0–100.0)
Monocytes Absolute: 0.5 10*3/uL (ref 0.2–1.0)
Monocytes Relative: 7 %
Neutro Abs: 3.3 10*3/uL (ref 1.4–6.5)
Neutrophils Relative %: 54 %
Platelets: 181 10*3/uL (ref 150–440)
RBC: 3.79 MIL/uL — ABNORMAL LOW (ref 4.40–5.90)
RDW: 12.8 % (ref 11.5–14.5)
WBC: 6.3 10*3/uL (ref 3.8–10.6)

## 2016-03-30 LAB — BASIC METABOLIC PANEL
Anion gap: 15 (ref 5–15)
BUN: 11 mg/dL (ref 6–20)
CALCIUM: 8.8 mg/dL — AB (ref 8.9–10.3)
CO2: 21 mmol/L — ABNORMAL LOW (ref 22–32)
CREATININE: 0.87 mg/dL (ref 0.61–1.24)
Chloride: 89 mmol/L — ABNORMAL LOW (ref 101–111)
GFR calc Af Amer: >60 mL/min (ref >60)
Glucose, Bld: 100 mg/dL — ABNORMAL HIGH (ref 65–99)
POTASSIUM: 3.9 mmol/L (ref 3.5–5.1)
SODIUM: 125 mmol/L — AB (ref 135–145)

## 2016-03-30 LAB — URINALYSIS COMPLETE WITH MICROSCOPIC (ARMC ONLY)
BACTERIA UA: NONE SEEN
Bilirubin Urine: NEGATIVE
Glucose, UA: NEGATIVE mg/dL
Hgb urine dipstick: NEGATIVE
Leukocytes, UA: NEGATIVE
NITRITE: NEGATIVE
PROTEIN: NEGATIVE mg/dL
SQUAMOUS EPITHELIAL / LPF: NONE SEEN
Specific Gravity, Urine: 1.008 (ref 1.005–1.030)
pH: 5 (ref 5.0–8.0)

## 2016-03-30 LAB — LACTIC ACID, PLASMA
Lactic Acid, Venous: 3.3 mmol/L (ref 0.5–2.0)
Lactic Acid, Venous: 2.9 mmol/L (ref 0.5–2.0)

## 2016-03-30 LAB — GLUCOSE, CAPILLARY: GLUCOSE-CAPILLARY: 190 mg/dL — AB (ref 65–99)

## 2016-03-30 MED ORDER — ACETAMINOPHEN 325 MG PO TABS
650.0000 mg | ORAL_TABLET | Freq: Four times a day (QID) | ORAL | Status: DC | PRN
Start: 2016-03-30 — End: 2016-04-06
  Administered 2016-04-01 – 2016-04-04 (×4): 650 mg via ORAL
  Filled 2016-03-30 (×4): qty 2

## 2016-03-30 MED ORDER — SOTALOL HCL 80 MG PO TABS
80.0000 mg | ORAL_TABLET | Freq: Two times a day (BID) | ORAL | Status: DC
  Administered 2016-03-31: 80 mg via ORAL
  Filled 2016-03-30 (×2): qty 1

## 2016-03-30 MED ORDER — ASPIRIN EC 81 MG PO TBEC: 81.0000 mg | DELAYED_RELEASE_TABLET | Freq: Every evening | ORAL | Status: DC

## 2016-03-30 MED ORDER — METFORMIN HCL 850 MG PO TABS
850.0000 mg | ORAL_TABLET | Freq: Two times a day (BID) | ORAL | Status: DC
  Administered 2016-03-31: 850 mg via ORAL
  Filled 2016-03-30 (×2): qty 1

## 2016-03-30 MED ORDER — ROSUVASTATIN CALCIUM 20 MG PO TABS
40.0000 mg | ORAL_TABLET | Freq: Every evening | ORAL | Status: DC
  Administered 2016-03-31 – 2016-04-05 (×6): 40 mg via ORAL
  Filled 2016-03-30: qty 2
  Filled 2016-03-30: qty 1
  Filled 2016-03-30 (×5): qty 2

## 2016-03-30 MED ORDER — SENNOSIDES-DOCUSATE SODIUM 8.6-50 MG PO TABS: 1.0000 | ORAL_TABLET | Freq: Every evening | ORAL | Status: DC | PRN

## 2016-03-30 MED ORDER — ONDANSETRON HCL 4 MG PO TABS: 4.0000 mg | ORAL_TABLET | Freq: Four times a day (QID) | ORAL | Status: DC | PRN

## 2016-03-30 MED ORDER — CARVEDILOL 6.25 MG PO TABS
12.5000 mg | ORAL_TABLET | Freq: Two times a day (BID) | ORAL | Status: DC
  Administered 2016-03-31: 12.5 mg via ORAL

## 2016-03-30 MED ORDER — CLOPIDOGREL BISULFATE 75 MG PO TABS: 75.0000 mg | ORAL_TABLET | Freq: Every evening | ORAL | Status: DC

## 2016-03-30 MED ORDER — SODIUM CHLORIDE 0.9 % IV BOLUS (SEPSIS)
1000.0000 mL | Freq: Once | INTRAVENOUS | Status: AC
  Administered 2016-03-30: 1000 mL via INTRAVENOUS

## 2016-03-30 MED ORDER — LEVOTHYROXINE SODIUM 75 MCG PO TABS
150.0000 ug | ORAL_TABLET | Freq: Every day | ORAL | Status: DC
  Administered 2016-03-31 – 2016-04-06 (×7): 150 ug via ORAL
  Filled 2016-03-30 (×4): qty 2
  Filled 2016-03-30: qty 1
  Filled 2016-03-30 (×2): qty 2

## 2016-03-30 MED ORDER — ACETAMINOPHEN 650 MG RE SUPP
650.0000 mg | Freq: Four times a day (QID) | RECTAL | Status: DC | PRN
Start: 2016-03-30 — End: 2016-04-06

## 2016-03-30 MED ORDER — METOPROLOL TARTRATE 5 MG/5ML IV SOLN
5.0000 mg | Freq: Four times a day (QID) | INTRAVENOUS | Status: DC | PRN
  Administered 2016-03-31 – 2016-04-04 (×3): 5 mg via INTRAVENOUS
  Filled 2016-03-30 (×2): qty 5

## 2016-03-30 MED ORDER — ENOXAPARIN SODIUM 40 MG/0.4ML ~~LOC~~ SOLN: 40.0000 mg | SUBCUTANEOUS | Status: DC

## 2016-03-30 MED ORDER — ONDANSETRON HCL 4 MG/2ML IJ SOLN: 4.0000 mg | Freq: Four times a day (QID) | INTRAMUSCULAR | Status: DC | PRN

## 2016-03-30 MED ORDER — POTASSIUM CHLORIDE IN NACL 20-0.9 MEQ/L-% IV SOLN
INTRAVENOUS | Status: DC
  Administered 2016-03-31 – 2016-04-01 (×3): via INTRAVENOUS
  Filled 2016-03-30 (×5): qty 1000

## 2016-03-30 MED ORDER — INSULIN ASPART 100 UNIT/ML ~~LOC~~ SOLN
0.0000 [IU] | Freq: Every day | SUBCUTANEOUS | Status: DC
  Administered 2016-04-01: 2 [IU] via SUBCUTANEOUS
  Filled 2016-03-30: qty 2

## 2016-03-30 MED ORDER — OMEGA-3-ACID ETHYL ESTERS 1 G PO CAPS
1.0000 g | ORAL_CAPSULE | Freq: Every evening | ORAL | Status: DC
  Administered 2016-03-31 – 2016-04-05 (×6): 1 g via ORAL
  Filled 2016-03-30 (×6): qty 1

## 2016-03-30 MED ORDER — INSULIN ASPART 100 UNIT/ML ~~LOC~~ SOLN
0.0000 [IU] | Freq: Three times a day (TID) | SUBCUTANEOUS | Status: DC
  Administered 2016-03-31: 3 [IU] via SUBCUTANEOUS
  Administered 2016-04-01: 5 [IU] via SUBCUTANEOUS
  Administered 2016-04-02: 2 [IU] via SUBCUTANEOUS
  Administered 2016-04-02: 3 [IU] via SUBCUTANEOUS
  Administered 2016-04-03 – 2016-04-04 (×2): 5 [IU] via SUBCUTANEOUS
  Administered 2016-04-05 (×2): 2 [IU] via SUBCUTANEOUS
  Filled 2016-03-30: qty 2
  Filled 2016-03-30: qty 5
  Filled 2016-03-30: qty 3
  Filled 2016-03-30: qty 5
  Filled 2016-03-30: qty 2
  Filled 2016-03-30: qty 5
  Filled 2016-03-30: qty 3
  Filled 2016-03-30: qty 2

## 2016-03-30 MED ORDER — LOSARTAN POTASSIUM 50 MG PO TABS: 50.0000 mg | ORAL_TABLET | Freq: Every evening | ORAL | Status: DC

## 2016-03-30 NOTE — ED Notes (Addendum)
Pt to ed via ems from home with c/o sob, cough, congestion, and per family altered mental status x 1 month.  Pt with stoma but no trach in place.  Per family he has not had a trach in place x 5 years.

## 2016-03-30 NOTE — ED Notes (Signed)
Pt to ed via ems from home.  Per wife pt has recently been congested and has had altered mental status.  Pt is non verbal but does respond with hand motions and shakes head up and down.  Pt alert on arrival to ed

## 2016-03-30 NOTE — ED Provider Notes (Signed)
[facility] Emergency Department Provider Note   ____________________________________________  Time seen:  I have reviewed the triage vital signs and the triage nursing note.  HISTORY  Chief Complaint Cough   Historian Patient is poor historian Wife provides most history  Patient from home   HPI Mitchell Huffman is a 75 y.o. 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 "For a few days now he has had some increasing chest congestion and has been coughing up some phlegm from his trach site. It is thick and white. No fever. Unclear whether or not he's had some mild shortness of breath, but no chest pain.  Wife states that she thinks he's been a little confused over the past 1 month.  When they had the ambulance, they were told that he had a lot of chest congestion and he should come in for evaluation.      Past Medical History  Diagnosis Date  . Diabetes mellitus without complication ([place])   . Chronic back pain   . Hypertension   . Stroke ([place])   . Hyperlipidemia     There are no active problems to display for this patient.   Past Surgical History  Procedure Laterality Date  . Bypass graft    . Cardiac catheterization    . Tracheostomy      Current Outpatient Rx  Name  Route  Sig  Dispense  Refill  . aspirin EC 81 MG tablet   Oral   Take 81 mg by mouth every evening.         . carvedilol (COREG) 12.5 MG tablet   Oral   Take 12.5 mg by mouth 2 (two) times daily with a meal.          . clopidogrel (PLAVIX) 75 MG tablet   Oral   Take 75 mg by mouth every evening.          Marland Kitchen CRESTOR 40 MG tablet   Oral   Take 40 mg by mouth every evening.            Dispense as written.   Marland Kitchen levothyroxine (SYNTHROID, LEVOTHROID) 150 MCG tablet   Oral   Take 150 mcg by mouth daily before breakfast.           . losartan (COZAAR) 50 MG tablet   Oral   Take 50 mg by mouth every evening.          . metFORMIN (GLUCOPHAGE) 850 MG tablet   Oral   Take 850 mg by mouth 2 (two) times daily with a meal.          . spironolactone (ALDACTONE) 25 MG tablet   Oral   Take 12.5 mg by mouth every evening.          . vitamin C (ASCORBIC ACID) 500 MG tablet   Oral   Take 500 mg by mouth every evening.         . cholecalciferol (VITAMIN D) 1000 UNITS tablet   Oral   Take 2,000 Units by mouth daily.         . Multiple Vitamins-Minerals (MULTIVITAMIN WITH MINERALS) tablet   Oral   Take 1 tablet by mouth daily.         . naproxen (NAPROSYN) 500 MG tablet   Oral   Take 1 tablet  by mouth 2 (two) times daily.         . Omega-3 Fatty Acids (FISH OIL) 1200 MG CAPS   Oral   Take 1 capsule by mouth daily.         . ondansetron (ZOFRAN ODT) 8 MG disintegrating tablet   Oral   Take 1 tablet (8 mg total) by mouth every 8 (eight) hours as needed for nausea or vomiting. Patient not taking: Reported on 05/28/2015   20 tablet   0   . PAIN RELIEF MAXIMUM STRENGTH 16 % GEL   Topical   Apply 1 application topically daily as needed.           Dispense as written.   Marland Kitchen tiZANidine (ZANAFLEX) 2 MG tablet   Oral   Take 1 tablet by mouth 4 (four) times daily.           Allergies Review of patient's allergies indicates no known allergies.  History reviewed. No pertinent family history.  Social History Social History  Substance Use Topics  . Smoking status: Former Research scientist (life sciences)  . Smokeless tobacco: None  . Alcohol Use: Yes    Review of Systems  Constitutional: Negative for fever. Eyes: Negative for visual changes. ENT: Negative for sore throat. Cardiovascular: Negative for chest pain. Respiratory: Positive for shortness of breath. Positive for white thick sputum production. Gastrointestinal: Negative for abdominal pain, vomiting and diarrhea. Genitourinary: Negative for  dysuria. Musculoskeletal: Negative for back pain. Skin: Negative for rash. Neurological: Negative for headache. 10 point Review of Systems otherwise negative ____________________________________________   PHYSICAL EXAM:  VITAL SIGNS: ED Triage Vitals  Enc Vitals Group     BP 03/30/16 1757 86/59 mmHg     Pulse Rate 03/30/16 1757 72     Resp 03/30/16 1757 20     Temp 03/30/16 1757 97.6 F (36.4 C)     Temp Source 03/30/16 1757 Oral     SpO2 03/30/16 1757 98 %     Weight 03/30/16 1757 185 lb (83.915 kg)     Height --      Head Cir --      Peak Flow --      Pain Score 03/30/16 1758 0     Pain Loc --      Pain Edu? --      Excl. in [place]? --      Constitutional: Alert and Cooperative. Well appearing overall and in no distress. HEENT   Head: Normocephalic and atraumatic.      Eyes: Conjunctivae are normal. PERRL. Normal extraocular movements.      Ears:         Nose: No congestion/rhinnorhea.   Mouth/Throat: Mucous membranes are mildly dry.   Neck: No stridor. Stoma at the trach site, clear mucus around that site. Cardiovascular/Chest: Irregularly irregular, tachycardic.Marland Kitchen  No murmurs, rubs, or gallops. Respiratory: Normal respiratory effort without tachypnea nor retractions. Moderate rhonchi all fields. No wheezing. Gastrointestinal: Soft. No distention, no guarding, no rebound. Nontender.   Genitourinary/rectal:Deferred Musculoskeletal: Nontender with normal range of motion in all extremities. No joint effusions.  No lower extremity tenderness.  No edema. Neurologic:  Some stuttering speech, no facial droop appreciated. No gross or focal neurologic deficits are appreciated. Skin:  Skin is warm, dry and intact. No rash noted. Psychiatric: No hallucinations.  ____________________________________________   EKG I, Lisa Roca, MD, the attending physician have personally viewed and interpreted all ECGs.   102 bpm. Atrial flutter. Narrow QRS. Normal axis. Nonspecific  ST and T-wave  ____________________________________________  LABS (pertinent positives/negatives)  Labs Reviewed  BASIC METABOLIC PANEL - Abnormal; Notable for the following:    Sodium 125 (*)    Chloride 89 (*)    CO2 21 (*)    Glucose, Bld 100 (*)    Calcium 8.8 (*)    All other components within normal limits  CBC WITH DIFFERENTIAL/PLATELET - Abnormal; Notable for the following:    RBC 3.79 (*)    Hemoglobin 12.3 (*)    HCT 36.3 (*)    All other components within normal limits  LACTIC ACID, PLASMA - Abnormal; Notable for the following:    Lactic Acid, Venous 3.3 (*)    All other components within normal limits  CULTURE, BLOOD (ROUTINE X 2)  CULTURE, BLOOD (ROUTINE X 2)  URINE CULTURE  URINALYSIS COMPLETEWITH MICROSCOPIC ([facility] ONLY)  LACTIC ACID, PLASMA    ____________________________________________  RADIOLOGY All Xrays were viewed by me. Imaging interpreted by Radiologist.  Chest x-ray:  IMPRESSION: Low lung volumes. No active cardiopulmonary disease. __________________________________________  PROCEDURES  Procedure(s) performed: None  Critical Care performed: CRITICAL CARE Performed by: Lisa Roca   Total critical care time: 30 minutes  Critical care time was exclusive of separately billable procedures and treating other patients.  Critical care was necessary to treat or prevent imminent or life-threatening deterioration.  Critical care was time spent personally by me on the following activities: development of treatment plan with patient and/or surrogate as well as nursing, discussions with consultants, evaluation of patient's response to treatment, examination of patient, obtaining history from patient or surrogate, ordering and performing treatments and interventions, ordering and review of laboratory studies, ordering and review of radiographic studies, pulse oximetry and re-evaluation of patient's  condition.   ____________________________________________   ED COURSE / ASSESSMENT AND PLAN  Pertinent labs & imaging results that were available during my care of the patient were reviewed by me and considered in my medical decision making (see chart for details).   Patient arrived with a complaint of congestion, but sounds like may be ongoing for several weeks but new phlegm production over the past few days. It's clear or white, and there's been no reported fever.  Patient looks like he might be a little dehydrated clinically, and his initial blood pressure is a little bit low. I'm starting with IV fluids first. I will go ahead and send blood cultures and lactate as well, but at this point in time without a chest x-ray infiltrate, no hypoxia, I'm not highly suspicious for pneumonia/sepsis.  His lactate did come back elevated at 3.3. After 1 L of fluids his systolic blood pressures of 97. Given elevated lactate, I am going to give him another liter of fluid here, and discussed with the hospitalist for admission and treatment with IV hydration overnight.    CONSULTATIONS:   Hospitalist for admission   Patient / Family / Caregiver informed of clinical course, medical decision-making process, and agree with plan.   ___________________________________________   FINAL CLINICAL IMPRESSION(S) / ED DIAGNOSES   Final diagnoses:  Dehydration  High serum lactate              Note: This dictation was prepared with Dragon dictation. Any transcriptional errors that result from this process are unintentional   Lisa Roca, MD 03/30/16 2012

## 2016-03-30 NOTE — H&P (Signed)
PCP:   Volanda Napoleon, MD   Chief Complaint:  Weak patient  HPI: This a 75 year old gentleman was brought to medical family because he just looked so peaked, pale and weak. In the ER the patient's blood pressure was low in the high 80s. His lactic acid level was also elevated. He was bolused 2 L and his blood pressures improved, but he is now in A. fib with RVR. The patient does have a history of A. Fib but he has been out of his sotalol for approximately a week. Have tried unsuccessfully to refilled his sotalol with no success. There does not report of any fever, chills, nausea, vomiting, diarrhea or burning urination. He has a cough but no wheeze. He has increased sputum production from his trach but this is clear. The family is unclear at this point what dose of sotalol the patient is on, they will bring in his pillbox. The patient was unable to talk due to trach. His son and wife provided the history. He is alert and aware and agreed with history as provided   Review of Systems:  The patient denies anorexia, fever, weight loss,, vision loss, decreased hearing, hoarseness, chest pain, syncope, dyspnea on exertion, peripheral edema, balance deficits, hemoptysis, abdominal pain, melena, hematochezia, severe indigestion/heartburn, hematuria, incontinence, genital sores, muscle weakness, suspicious skin lesions, transient blindness, difficulty walking, depression, unusual weight change, abnormal bleeding, enlarged lymph nodes, angioedema, and breast masses.  Past Medical History: Past Medical History  Diagnosis Date  . Diabetes mellitus without complication ([place])   . Chronic back pain   . Hypertension   . Stroke ([place])   . Hyperlipidemia    Past Surgical History  Procedure Laterality Date  . Bypass graft    . Cardiac catheterization    . Tracheostomy      Medications: Prior to Admission medications   Medication Sig Start Date End Date Taking? Authorizing Provider  aspirin EC 81  MG tablet Take 81 mg by mouth every evening.   Yes Historical Provider, MD  carvedilol (COREG) 12.5 MG tablet Take 12.5 mg by mouth 2 (two) times daily with a meal.    Yes Historical Provider, MD  cholecalciferol (VITAMIN D) 1000 UNITS tablet Take 2,000 Units by mouth every evening.    Yes Historical Provider, MD  clopidogrel (PLAVIX) 75 MG tablet Take 75 mg by mouth every evening.    Yes Historical Provider, MD  CRESTOR 40 MG tablet Take 40 mg by mouth every evening.    Yes Historical Provider, MD  levothyroxine (SYNTHROID, LEVOTHROID) 150 MCG tablet Take 150 mcg by mouth daily before breakfast.    Yes Historical Provider, MD  losartan (COZAAR) 50 MG tablet Take 50 mg by mouth every evening.    Yes Historical Provider, MD  metFORMIN (GLUCOPHAGE) 850 MG tablet Take 850 mg by mouth 2 (two) times daily with a meal.    Yes Historical Provider, MD  Multiple Vitamins-Minerals (MULTIVITAMIN WITH MINERALS) tablet Take 1 tablet by mouth every evening.    Yes Historical Provider, MD  omega-3 acid ethyl esters (LOVAZA) 1 g capsule Take 1 g by mouth every evening.   Yes Historical Provider, MD  spironolactone (ALDACTONE) 25 MG tablet Take 12.5 mg by mouth every evening.    Yes Historical Provider, MD  vitamin C (ASCORBIC ACID) 500 MG tablet Take 500 mg by mouth every evening.   Yes Historical Provider, MD    Allergies:  No Known Allergies  Social History:  reports that he has  quit smoking. He does not have any smokeless tobacco history on file. He reports that he drinks alcohol. He reports that he does not use illicit drugs.  Family History: History reviewed. Coronary artery disease  Physical Exam: Filed Vitals:   03/30/16 1757 03/30/16 1758 03/30/16 1800 03/30/16 1815  BP: 86/59  86/59 97/72  Pulse: 72  62 42  Temp: 97.6 F (36.4 C)     TempSrc: Oral     Resp: 20  20 24   Weight: 83.915 kg (185 lb)     SpO2: 98% 98% 97% 98%    General:  Alert and oriented times three, well developed and  nourished, no acute distress Eyes: PERRLA, pink conjunctiva, no scleral icterus ENT: Moist oral mucosa, neck supple, no thyromegaly, open trachea Lungs: clear to ascultation, no wheeze, no crackles, no use of accessory muscles Cardiovascular: irregular rate and rhythm, tachycardic, no regurgitation, no gallops, no murmurs. No carotid bruits, no JVD Abdomen: soft, positive BS, non-tender, non-distended, no organomegaly, not an acute abdomen GU: not examined Neuro: CN II - XII grossly intact, sensation intact Musculoskeletal: strength 5/5 all extremities, no clubbing, cyanosis or edema Skin: no rash, no subcutaneous crepitation, no decubitus Psych: appropriate patient   Labs on Admission:   Recent Labs  03/30/16 1815  NA 125*  K 3.9  CL 89*  CO2 21*  GLUCOSE 100*  BUN 11  CREATININE 0.87  CALCIUM 8.8*   No results for input(s): AST, ALT, ALKPHOS, BILITOT, PROT, ALBUMIN in the last 72 hours. No results for input(s): LIPASE, AMYLASE in the last 72 hours.  Recent Labs  03/30/16 1815  WBC 6.3  NEUTROABS 3.3  HGB 12.3*  HCT 36.3*  MCV 95.9  PLT 181   No results for input(s): CKTOTAL, CKMB, CKMBINDEX, TROPONINI in the last 72 hours. Invalid input(s): POCBNP No results for input(s): DDIMER in the last 72 hours. No results for input(s): HGBA1C in the last 72 hours. No results for input(s): CHOL, HDL, LDLCALC, TRIG, CHOLHDL, LDLDIRECT in the last 72 hours. No results for input(s): TSH, T4TOTAL, T3FREE, THYROIDAB in the last 72 hours.  Invalid input(s): FREET3 No results for input(s): VITAMINB12, FOLATE, FERRITIN, TIBC, IRON, RETICCTPCT in the last 72 hours.  Micro Results: No results found for this or any previous visit (from the past 240 hour(s)).   Radiological Exams on Admission: Dg Chest 2 View  03/30/2016  CLINICAL DATA:  COUGH, SHORTNESS BREATH, AND CHEST CONGESTION FOR 1 MONTH. CORONARY ARTERY DISEASE. EXAM: CHEST  2 VIEW COMPARISON:  05/28/2015 FINDINGS: Low lung  volumes are again demonstrated. Heart size is stable and probably within normal limits allowing for low lung volumes and AP view. No evidence of pulmonary infiltrate or edema. No evidence of pneumothorax or pleural effusion. Prior CABG noted. IMPRESSION: Low lung volumes.  No active cardiopulmonary disease. Electronically Signed   By: Earle Gell M.D.   On: 03/30/2016 18:46    Assessment/Plan Present on Admission:  . Atrial flutter with rapid ventricular response (HCC) -Admit to stepdown -Lopressor when necessary ordered, restart sotalol 80 mg by mouth twice a day. If this does not improve patient's heart rate without drop in patient's blood pressure may need to start a drip -Will start anticoagulation.  . Hypotension -Rebounded after 2 L IV fluids. Unclear etiology, may be due to patient's aflutter. Patient also the elevated lactic acid level but no clear evidence of infection. Repeat lactic acid level in a.m. -Continue to monitor, continue IV fluid hydration.  Hyponatremia -Replete  with IV fluids, repeat BMP in a.m. -Will order TSH  Coronary artery disease -Stable, home medications resumed   Diabetes mellitus type 2 -Stable, ADA diet, sliding-scale insulin -Home medications resumed   History of laryngeal cancer with a trach -Stable, aware  Dyslipidemia -Stable, resume home medications  History of CVA -Stable, resume home medication    Helen Cuff 03/30/2016, 9:34 PM

## 2016-03-30 NOTE — ED Notes (Signed)
Pt and relative provided with drinks.

## 2016-03-30 NOTE — ED Notes (Signed)
I liter of NS complete

## 2016-03-31 ENCOUNTER — Encounter: Payer: Self-pay | Admitting: *Deleted

## 2016-03-31 ENCOUNTER — Inpatient Hospital Stay: Admit: 2016-03-31 | Payer: Medicare HMO

## 2016-03-31 DIAGNOSIS — E785 Hyperlipidemia, unspecified: Secondary | ICD-10-CM

## 2016-03-31 DIAGNOSIS — C140 Malignant neoplasm of pharynx, unspecified: Secondary | ICD-10-CM

## 2016-03-31 DIAGNOSIS — I10 Essential (primary) hypertension: Secondary | ICD-10-CM

## 2016-03-31 DIAGNOSIS — Z8673 Personal history of transient ischemic attack (TIA), and cerebral infarction without residual deficits: Secondary | ICD-10-CM

## 2016-03-31 DIAGNOSIS — I4892 Unspecified atrial flutter: Secondary | ICD-10-CM

## 2016-03-31 DIAGNOSIS — I2510 Atherosclerotic heart disease of native coronary artery without angina pectoris: Secondary | ICD-10-CM

## 2016-03-31 DIAGNOSIS — I952 Hypotension due to drugs: Secondary | ICD-10-CM

## 2016-03-31 LAB — CBC
HEMATOCRIT: 35.3 % — AB (ref 40.0–52.0)
HEMOGLOBIN: 12.3 g/dL — AB (ref 13.0–18.0)
MCH: 33.2 pg (ref 26.0–34.0)
MCHC: 34.7 g/dL (ref 32.0–36.0)
MCV: 95.6 fL (ref 80.0–100.0)
Platelets: 175 10*3/uL (ref 150–440)
RBC: 3.69 MIL/uL — ABNORMAL LOW (ref 4.40–5.90)
RDW: 12.8 % (ref 11.5–14.5)
WBC: 4.6 10*3/uL (ref 3.8–10.6)

## 2016-03-31 LAB — LIPID PANEL
CHOLESTEROL: 101 mg/dL (ref 0–200)
HDL: 60 mg/dL (ref >40)
LDL CALC: 31 mg/dL (ref 0–99)
TRIGLYCERIDES: 51 mg/dL (ref <150)
Total CHOL/HDL Ratio: 1.7 RATIO
VLDL: 10 mg/dL (ref 0–40)

## 2016-03-31 LAB — BASIC METABOLIC PANEL
ANION GAP: 5 (ref 5–15)
BUN: 8 mg/dL (ref 6–20)
CO2: 28 mmol/L (ref 22–32)
Calcium: 8.5 mg/dL — ABNORMAL LOW (ref 8.9–10.3)
Chloride: 102 mmol/L (ref 101–111)
Creatinine, Ser: 0.77 mg/dL (ref 0.61–1.24)
GFR calc Af Amer: >60 mL/min (ref >60)
GFR calc non Af Amer: >60 mL/min (ref >60)
GLUCOSE: 75 mg/dL (ref 65–99)
POTASSIUM: 4.3 mmol/L (ref 3.5–5.1)
Sodium: 135 mmol/L (ref 135–145)

## 2016-03-31 LAB — TSH: TSH: 1.178 u[IU]/mL (ref 0.350–4.500)

## 2016-03-31 LAB — MAGNESIUM: Magnesium: 1.3 mg/dL — ABNORMAL LOW (ref 1.7–2.4)

## 2016-03-31 LAB — MRSA PCR SCREENING: MRSA by PCR: NEGATIVE

## 2016-03-31 LAB — GLUCOSE, CAPILLARY
Glucose-Capillary: 66 mg/dL (ref 65–99)
Glucose-Capillary: 174 mg/dL — ABNORMAL HIGH (ref 65–99)
Glucose-Capillary: 77 mg/dL (ref 65–99)
Glucose-Capillary: 120 mg/dL — ABNORMAL HIGH (ref 65–99)

## 2016-03-31 LAB — HEMOGLOBIN A1C: Hgb A1c MFr Bld: 6 % (ref 4.0–6.0)

## 2016-03-31 MED ORDER — DIGOXIN 0.25 MG/ML IJ SOLN
0.5000 mg | Freq: Once | INTRAMUSCULAR | Status: AC
  Administered 2016-03-31: 0.5 mg via INTRAVENOUS
  Filled 2016-03-31: qty 2

## 2016-03-31 MED ORDER — ENOXAPARIN SODIUM 80 MG/0.8ML ~~LOC~~ SOLN
1.0000 mg/kg | Freq: Two times a day (BID) | SUBCUTANEOUS | Status: DC
  Administered 2016-03-31 – 2016-04-01 (×4): 70 mg via SUBCUTANEOUS
  Filled 2016-03-31 (×3): qty 0.8

## 2016-03-31 MED ORDER — MAGNESIUM SULFATE 2 GM/50ML IV SOLN
2.0000 g | Freq: Once | INTRAVENOUS | Status: AC
  Administered 2016-03-31: 2 g via INTRAVENOUS
  Filled 2016-03-31: qty 50

## 2016-03-31 MED ORDER — SOTALOL HCL 80 MG PO TABS
80.0000 mg | ORAL_TABLET | Freq: Two times a day (BID) | ORAL | Status: DC
  Administered 2016-03-31 – 2016-04-02 (×4): 80 mg via ORAL
  Filled 2016-03-31 (×6): qty 1

## 2016-03-31 MED ORDER — DIGOXIN 125 MCG PO TABS
0.1250 mg | ORAL_TABLET | Freq: Every day | ORAL | Status: DC
  Administered 2016-04-01: 0.125 mg via ORAL
  Filled 2016-03-31: qty 1

## 2016-03-31 MED ORDER — METOPROLOL TARTRATE 25 MG PO TABS
12.5000 mg | ORAL_TABLET | Freq: Two times a day (BID) | ORAL | Status: DC
Start: 2016-03-31 — End: 2016-04-01
  Administered 2016-03-31 – 2016-04-01 (×3): 12.5 mg via ORAL
  Filled 2016-03-31 (×3): qty 1

## 2016-03-31 NOTE — Progress Notes (Signed)
Patient ID: Mitchell Huffman, male   DOB: 1941/08/03, 75 y.o.   MRN: [id number] [facility] PROGRESS NOTE  Mitchell Huffman [id number] DOB: 08/28/1941 DOA: 03/30/2016 PCP: Volanda Napoleon, MD  HPI/Subjective: Patient feels fine and wants to go home. He states that his wife brought him in. His wife states that he was confused and looked very white and new something was wrong.  Objective: Filed Vitals:   03/31/16 1200 03/31/16 1300  BP: 117/67 127/86  Pulse: 129 127  Temp: 98 F (36.7 C)   Resp: 19 20    Filed Weights   03/30/16 1757 03/30/16 2325  Weight: 83.915 kg (185 lb) 72.3 kg (159 lb 6.3 oz)    ROS: Review of Systems  Constitutional: Negative for fever and chills.  Eyes: Negative for blurred vision.  Respiratory: Negative for cough and shortness of breath.   Cardiovascular: Negative for chest pain.  Gastrointestinal: Negative for nausea, vomiting, abdominal pain, diarrhea and constipation.  Genitourinary: Negative for dysuria.  Musculoskeletal: Negative for joint pain.  Neurological: Negative for dizziness and headaches.   Exam: Physical Exam  Constitutional: He is oriented to person, place, and time.  HENT:  Nose: No mucosal edema.  Mouth/Throat: No oropharyngeal exudate or posterior oropharyngeal edema.  Eyes: Conjunctivae, EOM and lids are normal. Pupils are equal, round, and reactive to light.  Neck: No JVD present. Carotid bruit is not present. No edema present. No thyroid mass and no thyromegaly present.  Cardiovascular: S1 normal and S2 normal.  Exam reveals no gallop.   No murmur heard. Pulses:      Dorsalis pedis pulses are 2+ on the right side, and 2+ on the left side.  Respiratory: No respiratory distress. He has no wheezes. He has no rhonchi. He has no rales.  GI: Soft. Bowel sounds are normal. There is no tenderness.  Musculoskeletal:       Right ankle: He exhibits no swelling.       Left ankle: He exhibits no swelling.  Lymphadenopathy:     He has no cervical adenopathy.  Neurological: He is alert and oriented to person, place, and time. No cranial nerve deficit.  Skin: Skin is warm. No rash noted. Nails show no clubbing.  Psychiatric: He has a normal mood and affect.      Data Reviewed: Basic Metabolic Panel:  Recent Labs Lab 03/30/16 1815 03/31/16 0504  NA 125* 135  K 3.9 4.3  CL 89* 102  CO2 21* 28  GLUCOSE 100* 75  BUN 11 8  CREATININE 0.87 0.77  CALCIUM 8.8* 8.5*  MG  --  1.3*   CBC:  Recent Labs Lab 03/30/16 1815 03/31/16 0504  WBC 6.3 4.6  NEUTROABS 3.3  --   HGB 12.3* 12.3*  HCT 36.3* 35.3*  MCV 95.9 95.6  PLT 181 175    CBG:  Recent Labs Lab 03/30/16 2326 03/31/16 0727 03/31/16 1123 03/31/16 1630  GLUCAP 190* 66 174* 77    Recent Results (from the past 240 hour(s))  Culture, blood (routine x 2)     Status: None (Preliminary result)   Collection Time: 03/30/16  6:15 PM  Result Value Ref Range Status   Specimen Description BLOOD LEFT WRIST  Final   Special Requests BOTTLES DRAWN AEROBIC AND ANAEROBIC  8CC  Final   Culture NO GROWTH < 12 HOURS  Final   Report Status PENDING  Incomplete  Culture, blood (routine x 2)     Status: None (Preliminary result)  Collection Time: 03/30/16  6:20 PM  Result Value Ref Range Status   Specimen Description BLOOD LEFT FATTY CASTS  Final   Special Requests BOTTLES DRAWN AEROBIC AND ANAEROBIC  10CC  Final   Culture NO GROWTH < 12 HOURS  Final   Report Status PENDING  Incomplete  MRSA PCR Screening     Status: None   Collection Time: 03/30/16 11:30 PM  Result Value Ref Range Status   MRSA by PCR NEGATIVE NEGATIVE Final    Comment:        The GeneXpert MRSA Assay (FDA approved for NASAL specimens only), is one component of a comprehensive MRSA colonization surveillance program. It is not intended to diagnose MRSA infection nor to guide or monitor treatment for MRSA infections.      Studies: Dg Chest 2 View  03/30/2016  CLINICAL  DATA:  COUGH, SHORTNESS BREATH, AND CHEST CONGESTION FOR 1 MONTH. CORONARY ARTERY DISEASE. EXAM: CHEST  2 VIEW COMPARISON:  05/28/2015 FINDINGS: Low lung volumes are again demonstrated. Heart size is stable and probably within normal limits allowing for low lung volumes and AP view. No evidence of pulmonary infiltrate or edema. No evidence of pneumothorax or pleural effusion. Prior CABG noted. IMPRESSION: Low lung volumes.  No active cardiopulmonary disease. Electronically Signed   By: Earle Gell M.D.   On: 03/30/2016 18:46    Scheduled Meds: . [START ON 04/01/2016] digoxin  0.125 mg Oral Daily  . enoxaparin (LOVENOX) injection  1 mg/kg Subcutaneous Q12H  . insulin aspart  0-15 Units Subcutaneous TID WC  . insulin aspart  0-5 Units Subcutaneous QHS  . levothyroxine  150 mcg Oral QAC breakfast  . magnesium sulfate 1 - 4 g bolus IVPB  2 g Intravenous Once  . metFORMIN  850 mg Oral BID WC  . metoprolol tartrate  12.5 mg Oral BID  . omega-3 acid ethyl esters  1 g Oral QPM  . rosuvastatin  40 mg Oral QPM   Continuous Infusions: . 0.9 % NaCl with KCl 20 mEq / L 100 mL/hr at 03/31/16 0843    Assessment/Plan:  1. Atrial flutter with rapid ventricular response. Patient started on digoxin and low-dose metoprolol. Hopefully once we control heart rate his blood pressure should come up. I will give 1 dose of IV magnesium. Patient was started on high-dose Lovenox. 2. Hyponatremia. This has improved. 3. Lactic acidosis. Stop metformin. 4. Hypothyroidism unspecified on levothyroxine 5. Hyperlipidemia unspecified on Crestor 6. Type 2 diabetes with sugars all in a good range so far 7. Chronic back pain 8. History of stroke and peripheral vascular disease  Code Status:     Code Status Orders        Start     Ordered   03/30/16 2324  Full code   Continuous     03/30/16 2323    Code Status History    Date Active Date Inactive Code Status Order ID Comments User Context   This patient has a  current code status but no historical code status.     Family Communication: Family at bedside Disposition Plan: Home once heart rate improved  Consultants:  Cardiology  Time spent: 25 minutes  [place], [place]

## 2016-03-31 NOTE — Clinical Social Work Note (Signed)
CSW was consulted in error for Advanced Directives. [facility] has also been consulted and they will facilitate Advanced Directives if appropriate. Shela Leff MSW,LCSW [phone]

## 2016-03-31 NOTE — Consult Note (Signed)
Cardiology Consultation Note  Patient ID: Mitchell Huffman, MRN: [id number], DOB/AGE: September 30, 1941 75 y.o. Admit date: 03/30/2016   Date of Consult: 03/31/2016 Primary Physician: Volanda Napoleon, MD Primary Cardiologist: New to [facility] (prior patient of Dr. Chancy Milroy, MD - patient and his family request [facility]) Requesting Physician: Lindwood Qua, NP (PCCM)  Chief Complaint: Fatigue Reason for Consult: Atrial flutter with RVR  HPI: 75 y.o. male with h/o CAD s/p 4 vessel CABG in 2007 (no prior cardiology notes are available as patient previously saw Dr. 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 He had been out of his sotalol for several weeks and reportedly primary cardiologist would not fill Rx without OV. Patient had an OV on 04/14/2016.   Patient and his wife report being diagnosed with new onset atrial flutter in 09/2015. He was placed on sotalol at that time. No anticoagulation. Patient remained in aspirin and Plavix. CHADS2VASc at least 21 with HTN, age, DM, stroke x 2, and vascular disease. CHF status is unknown given lack of prior records. His wife reports since he was originally diagnosed he has dealt with a tachycardic heart rate in the 120's to 140's bpm. As of late he has been more symptomatic with decreased appetite and generalized fatigue. Both he and his wife deny fevers or chills. No recent illnesses, trauma, or increased back from  his baseline back pain. He drinks 2-3 twelve oz beers daily.   Upon the patient's arrival to [facility] they were found to have an elevated lactic acid of 3.3-->2.9, WBC 6.3-->4.6, hgb 12.3-->12.3, Na 125-->135, K+ 3.9-->4.3, blood cultures pending x 2 with no growth at < 12 hours. ECG showed typical atrial flutter, 102 bpm, variable AV conduction, nonspecific st/t changes, CXR showed low lung volumes without active cardiopulmonary disease. On telemetry he is in atrial flutter with RVR with heart rates in the 120 bpm range. He has not been started on full-dose anticoagulation. His sotalol was restarted upon admission.   Past Medical History  Diagnosis Date  . Diabetes mellitus ([place])   . Chronic back pain   . Hypertension   . Stroke [facility])     a. x 2 in 1995  . Hyperlipidemia   . CAD (coronary artery disease)     a. s/p 4 vessel CABG in 2007, no interventions since  . Atrial flutter ([name])     a. originally diagnosed 09/2015; b. not on full-dose anticoagulation coming into the hospital; c. CHADS2VASc at least 6 (HTN, age, DM, stroke x 2, vascular disease)  . Throat cancer ([place])     a. 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 s/p tracheostmy and XRT in 2007  . Carotid artery disease ([place])     a. s/p left-sided stenting in 1995      Most Recent Cardiac Studies: None available for review (will attempt to get records from Dr. Marella Bile office)   Surgical History:  Past Surgical History  Procedure Laterality Date  .  Bypass graft    . Cardiac catheterization    . Tracheostomy       Home Meds: Prior to Admission medications   Medication Sig Start Date End Date Taking? Authorizing Provider  aspirin EC 81 MG tablet Take 81 mg by mouth every evening.   Yes Historical Provider, MD  carvedilol (COREG) 12.5 MG tablet Take 12.5 mg by mouth 2 (two) times daily with a meal.    Yes Historical Provider, MD  cholecalciferol (VITAMIN D) 1000 UNITS tablet Take 2,000 Units by mouth every evening.     Yes Historical Provider, MD  clopidogrel (PLAVIX) 75 MG tablet Take 75 mg by mouth every evening.    Yes Historical Provider, MD  CRESTOR 40 MG tablet Take 40 mg by mouth every evening.    Yes Historical Provider, MD  levothyroxine (SYNTHROID, LEVOTHROID) 150 MCG tablet Take 150 mcg by mouth daily before breakfast.    Yes Historical Provider, MD  losartan (COZAAR) 50 MG tablet Take 50 mg by mouth every evening.    Yes Historical Provider, MD  metFORMIN (GLUCOPHAGE) 850 MG tablet Take 850 mg by mouth 2 (two) times daily with a meal.    Yes Historical Provider, MD  Multiple Vitamins-Minerals (MULTIVITAMIN WITH MINERALS) tablet Take 1 tablet by mouth every evening.    Yes Historical Provider, MD  omega-3 acid ethyl esters (LOVAZA) 1 g capsule Take 1 g by mouth every evening.   Yes Historical Provider, MD  spironolactone (ALDACTONE) 25 MG tablet Take 12.5 mg by mouth every evening.    Yes Historical Provider, MD  vitamin C (ASCORBIC ACID) 500 MG tablet Take 500 mg by mouth every evening.   Yes Historical Provider, MD    Inpatient Medications:  . aspirin EC  81 mg Oral QPM  . carvedilol  12.5 mg Oral BID WC  . clopidogrel  75 mg Oral QPM  . enoxaparin (LOVENOX) injection  1 mg/kg Subcutaneous Q12H  . insulin aspart  0-15 Units Subcutaneous TID WC  . insulin aspart  0-5 Units Subcutaneous QHS  . levothyroxine  150 mcg Oral QAC breakfast  . losartan  50 mg Oral QPM  . metFORMIN  850 mg Oral BID WC  . omega-3 acid ethyl esters  1 g Oral QPM  . rosuvastatin  40 mg Oral QPM  . sotalol  80 mg Oral Q12H   . 0.9 % NaCl with KCl 20 mEq / L 100 mL/hr at 03/31/16 [id number]    Allergies: No Known Allergies  Social History   Social History  . Marital Status: Married    Spouse Name: N/A  . Number of Children: N/A  . Years of Education: N/A   Occupational History  . Not on file.   Social History Main Topics  . Smoking status: Former Smoker -- 3.00 packs/day for 135 years    Types: Cigarettes     Start date: 11/01/1948    Quit date: 11/01/1993  . Smokeless tobacco: Not on file  . Alcohol Use: 2.4 oz/week    4 Standard drinks or equivalent per week  . Drug Use: No  . Sexual Activity: Not on file   Other Topics Concern  . Not on file   Social History Narrative     Family History  Problem Relation Age of Onset  . Hypertension Father   . Hyperlipidemia Father      Review of Systems: Review of Systems  Constitutional: Positive for weight loss and malaise/fatigue. Negative for fever, chills and diaphoresis.  HENT: Negative for congestion.   Eyes: Negative for discharge and redness.  Respiratory: Positive for shortness of breath. Negative for cough, hemoptysis, sputum production and wheezing.   Cardiovascular: Positive for palpitations. Negative for chest pain, orthopnea, claudication, leg swelling and PND.  Gastrointestinal: Negative for heartburn, nausea, vomiting, abdominal pain, diarrhea, constipation, blood in stool and melena.  Genitourinary: Negative for hematuria.  Musculoskeletal: Negative for myalgias and falls.  Skin: Negative for rash.  Neurological: Positive for weakness. Negative for dizziness, sensory change, speech change, focal weakness, loss of consciousness and headaches.  Endo/Heme/Allergies: Does not bruise/bleed easily.  Psychiatric/Behavioral: Negative for substance abuse. The patient is not nervous/anxious.   All other systems reviewed and are negative.   Labs: No results for input(s): CKTOTAL, CKMB, TROPONINI in the last 72 hours. Lab Results  Component Value Date   WBC 4.6 03/31/2016   HGB 12.3* 03/31/2016   HCT 35.3* 03/31/2016   MCV 95.6 03/31/2016   PLT 175 03/31/2016     Recent Labs Lab 03/31/16 0504  NA 135  K 4.3  CL 102  CO2 28  BUN 8  CREATININE 0.77  CALCIUM 8.5*  GLUCOSE 75   No results found for: CHOL, HDL, LDLCALC, TRIG No results found for: DDIMER  Radiology/Studies:  Dg Chest 2 View  03/30/2016  CLINICAL DATA:   COUGH, SHORTNESS BREATH, AND CHEST CONGESTION FOR 1 MONTH. CORONARY ARTERY DISEASE. EXAM: CHEST  2 VIEW COMPARISON:  05/28/2015 FINDINGS: Low lung volumes are again demonstrated. Heart size is stable and probably within normal limits allowing for low lung volumes and AP view. No evidence of pulmonary infiltrate or edema. No evidence of pneumothorax or pleural effusion. Prior CABG noted. IMPRESSION: Low lung volumes.  No active cardiopulmonary disease. Electronically Signed   By: Earle Gell M.D.   On: 03/30/2016 18:46    EKG: typical atrial flutter, 102 bpm, variable AV conduction, nonspecific st/t changes  Weights: Filed Weights   03/30/16 1757 03/30/16 2325  Weight: 185 lb (83.915 kg) 159 lb 6.3 oz (72.3 kg)     Physical Exam: Blood pressure 110/72, pulse 127, temperature 97.9 F (36.6 C), temperature source Axillary, resp. rate 24, height 5\' 5"  (1.651 m), weight 159 lb 6.3 oz (72.3 kg), SpO2 100 %. Body mass index is 26.52 kg/(m^2). General: Well developed, well nourished, in no acute distress. Head: Normocephalic, atraumatic, sclera non-icteric, no xanthomas, nares are without discharge.  Neck: Negative for carotid bruits. JVD not elevated. Tracheostomy noted.  Lungs: Clear bilaterally to auscultation without wheezes, rales, or rhonchi. Breathing is unlabored. Heart: Tachycardic with S1 S2. No murmurs, rubs, or gallops appreciated. Abdomen: Soft, non-tender, non-distended with normoactive bowel sounds. No hepatomegaly. No rebound/guarding. No obvious abdominal masses. Msk:  Strength and tone appear normal for age. Extremities: No clubbing or cyanosis. No edema.  Distal pedal pulses are 2+ and equal bilaterally. Neuro: Alert and oriented X 3. No facial asymmetry. No focal deficit. Moves all extremities spontaneously. Psych:  Responds to questions appropriately with a normal affect.    Assessment and Plan:  Principal Problem:   Atrial flutter with rapid ventricular response  (HCC) Active Problems:   Hypotension   CAD (coronary artery disease)   Diabetes mellitus (HCC)   H/O: CVA (cerebrovascular accident)   Dyslipidemia   HTN (hypertension)   Throat cancer ([place])    1. Typical atrial flutter with RVR: -Reportedly he has been in this rhythm with a tachycardic heart rate since at least November 2016 on sotalol only -Never on  long term, full-dose anticoagulation  -He has been out of his sotalol for several weeks and was unable to get a refill from his primary cardiologist with an [place] (OV was scheduled for 04/14/2016) -He has been hypotensive with blood pressures in the [id number] systolic, currently [id number] systolic, which precludes the usage of CCB or further BB -He has not been adequately anticoagulated which precludes the usage of amiodarone at this time -Change Coreg to Lopressor for added rate control  -Would hold sotalol in the setting of no anticoagulation  -Given that he is hemodynamically stable currently, the only option for rate control at this time is digoxin. Will load via IV currently followed by PO on 6/1 in an effort to improve rate control -On full-dose Lovenox for anticoagulation given his elevated CHADS2VASc of 6 (HTN, age, DM, stroke x 2, vascular disease) -Ultimately he will need Eliquis or Xarelto with the cessation of aspirin  -Given that he has reportedly been in this rhythm since the fall of 2016 there is concern for a tachy-mediated cardiomyopathy, perhaps on top of ICM given the patient's history -Will need to check an echo to evaluate LVSF, wall motion, and right-sided pressure once he is rate controlled -If he becomes hemodynamically unstable will need to pursue emergent DCCV, even though eh has not been adequately anticoagulated  -Options in the future include possible rate control with planned DCCV either inpatient or outpatient depending on how his heart rate improves and how his symptoms improve -Long term therapy of atrial flutter would be  with an ablation from EP, could be done as an outpatient  -Primary team to continue to evaluate for infections playing a role in the above  2. CAD s/p CABG as above: -No symptoms concerning for ischemia at this time -Check echo as above -With the addition of heparin gtt as above will discontinue aspirin and Plavix for now -He will likely need to resume Plavix prior to discharge along with DOAC -Beta blocker as above -Depending on EF, may need to chang to Coreg long term  3. Hypotension: -Precludes CCB at this time -Discontinue losartan in an effort to gain improved BP  -Concerns for cardiomyopathy in the setting of his atrial flutter leading to low BP -Restoration of sinus may aid in improving his BP  4. Stage 4 throat cancer: -S/p tracheostomy and XRT -Per primary team  5. History of stroke x 2 in 1995: -S/p stenting to LICA -As above, will need DOAC and possibly Plavix  6. HLD: -Crestor -Needs lipid panel  7. DM: -Per primary team -Needs A1C  8. Malnutrition: -Could supplement with Boost/Ensure -Consider nutrition evaluation   9. Hypothyroidism:  -In the setting of cancer as above -On repletion as above   Melvern Banker, PA-C Pager: [phone] 03/31/2016, 12:11 PM

## 2016-03-31 NOTE — Care Management (Signed)
RNCM referral for home health needs. Met with patient's wife outside of room. She is patient's primary caregiver at home. She assists with his adls and transportation.  He uses a cane, Rolator and shower bench. PCP is Dr.[email or web address]. Wife requesting home health assistance at [place]. Prefers [facility]. Referral to [place] at [facility]. Wife denies issues obtaining medications, copays or medical care. She states he ran out of his cardiac medication prior to admission because his cardiologist would not refill the medication unless he had an appointment which he did June 16 according to wife.

## 2016-03-31 NOTE — Progress Notes (Signed)
Dr.Ram aware of pt blood pressure, 88/65 (73). Orders given to go ahead and give Sotalol. Verified with pt nurse Alvino Blood. Will continue to assess

## 2016-03-31 NOTE — Plan of Care (Signed)
Problem: Activity: Goal: Ability to tolerate increased activity will improve Outcome: Progressing Up to recliner most of day.  Up to [facility] x2.  Stands to use urinal.  HR increased with activity.  Problem: Cardiac: Goal: Ability to achieve and maintain adequate cardiopulmonary perfusion will improve Outcome: Progressing BP improved with better controlled AFlutter.  Digoxin and metoprolol added today.  Continue sotalol.  Problem: Education: Goal: Knowledge of disease or condition will improve Outcome: Progressing Purpose of meds discussed and their side effects.  Seen by Dr Ellyn Hack and his PA today.  Problem: Spiritual Needs Goal: Ability to function at adequate level Outcome: Progressing Seen by chaplain at wife request.

## 2016-04-01 ENCOUNTER — Inpatient Hospital Stay (HOSPITAL_COMMUNITY)
Admit: 2016-04-01 | Discharge: 2016-04-01 | Disposition: A | Discharge status: Home / Self Care | Payer: Medicare HMO | Attending: Physician Assistant | Admitting: Physician Assistant

## 2016-04-01 DIAGNOSIS — I4892 Unspecified atrial flutter: Secondary | ICD-10-CM

## 2016-04-01 LAB — MAGNESIUM: MAGNESIUM: 1.6 mg/dL — AB (ref 1.7–2.4)

## 2016-04-01 LAB — URINE CULTURE

## 2016-04-01 LAB — BASIC METABOLIC PANEL
Anion gap: 6 (ref 5–15)
BUN: 9 mg/dL (ref 6–20)
CHLORIDE: 106 mmol/L (ref 101–111)
CO2: 27 mmol/L (ref 22–32)
CREATININE: 0.69 mg/dL (ref 0.61–1.24)
Calcium: 8.8 mg/dL — ABNORMAL LOW (ref 8.9–10.3)
GFR calc Af Amer: >60 mL/min (ref >60)
GFR calc non Af Amer: >60 mL/min (ref >60)
Glucose, Bld: 93 mg/dL (ref 65–99)
Potassium: 4.4 mmol/L (ref 3.5–5.1)
SODIUM: 139 mmol/L (ref 135–145)

## 2016-04-01 LAB — DIGOXIN LEVEL: DIGOXIN LVL: 1 ng/mL (ref 0.8–2.0)

## 2016-04-01 LAB — ECHOCARDIOGRAM COMPLETE
HEIGHTINCHES: 65 in
WEIGHTICAEL: 2550.28 [oz_av]

## 2016-04-01 LAB — GLUCOSE, CAPILLARY
GLUCOSE-CAPILLARY: 114 mg/dL — AB (ref 65–99)
GLUCOSE-CAPILLARY: 230 mg/dL — AB (ref 65–99)
GLUCOSE-CAPILLARY: 93 mg/dL (ref 65–99)
GLUCOSE-CAPILLARY: 218 mg/dL — AB (ref 65–99)

## 2016-04-01 MED ORDER — DIGOXIN 125 MCG PO TABS: 0.1250 mg | ORAL_TABLET | Freq: Every day | ORAL | Status: DC

## 2016-04-01 MED ORDER — METOPROLOL TARTRATE 25 MG PO TABS
25.0000 mg | ORAL_TABLET | Freq: Two times a day (BID) | ORAL | Status: DC
  Administered 2016-04-01: 25 mg via ORAL
  Filled 2016-04-01: qty 1

## 2016-04-01 MED ORDER — MAGNESIUM SULFATE 2 GM/50ML IV SOLN
2.0000 g | Freq: Once | INTRAVENOUS | Status: AC
  Administered 2016-04-01: 2 g via INTRAVENOUS
  Filled 2016-04-01: qty 50

## 2016-04-01 MED ORDER — ASPIRIN 81 MG PO CHEW
81.0000 mg | CHEWABLE_TABLET | Freq: Every day | ORAL | Status: DC
  Administered 2016-04-01: 81 mg via ORAL
  Filled 2016-04-01: qty 1

## 2016-04-01 MED ORDER — MAGNESIUM OXIDE 400 (241.3 MG) MG PO TABS
400.0000 mg | ORAL_TABLET | Freq: Every day | ORAL | Status: DC
  Administered 2016-04-01 – 2016-04-02 (×2): 400 mg via ORAL
  Filled 2016-04-01 (×3): qty 1

## 2016-04-01 MED ORDER — RIVAROXABAN 20 MG PO TABS
20.0000 mg | ORAL_TABLET | Freq: Every day | ORAL | Status: DC
  Administered 2016-04-01 – 2016-04-05 (×5): 20 mg via ORAL
  Filled 2016-04-01 (×6): qty 1

## 2016-04-01 MED ORDER — METOPROLOL TARTRATE 25 MG PO TABS
12.5000 mg | ORAL_TABLET | Freq: Once | ORAL | Status: AC
  Administered 2016-04-01: 12.5 mg via ORAL
  Filled 2016-04-01: qty 1

## 2016-04-01 NOTE — Progress Notes (Signed)
75 yo wm transferred from CCU to room 249 via wc.  Alert, nonverbal, communicates with nods and written communication.  Tracheostomy noted, site clean, pink.  Cardiac monitor applied and verified with Marzetta Board, CNA. Denies chest pain, remains in Aflutter.  Lungs diminished lower lobes bil.  Oriented to room and surroundings. POC reviewed with pt.  Denies need. CB in reach, SR up x 2, in chair with chair alarm.

## 2016-04-01 NOTE — Progress Notes (Signed)
RN received phone call from pt's daughter, Jenil Gaulding requesting that pt's sister Dewain Fogg not be allowed in patient's room. Secretary notified, sign placed on door for all visitors to check in at nursing station before entering room. Rachael Fee, RN

## 2016-04-01 NOTE — Progress Notes (Signed)
*  PRELIMINARY RESULTS* Echocardiogram 2D Echocardiogram has been performed.  Mitchell Huffman 04/01/2016, 1:03 PM

## 2016-04-01 NOTE — Progress Notes (Signed)
Dr. Fletcher Anon paged with questions regarding digoxin order. Awaiting return page. 2A aware.

## 2016-04-01 NOTE — Progress Notes (Signed)
Patient: Mitchell Huffman / Admit Date: 03/30/2016 / Date of Encounter: 04/01/2016, 10:43 AM   Subjective: No acute overnight events. Heart rate improved this morning with readings in the 70's to 90's while up sitting in the chair. Tolerating rhythm well. Awaiting echo once heart rate is well controlled. Digoxin 1.0 this morning. Blood pressure much improved with readings in the Q000111Q to 123XX123 systolic after holding losartan.   Review of Systems: Review of Systems  Constitutional: Positive for weight loss and malaise/fatigue. Negative for fever, chills and diaphoresis.  HENT: Negative for congestion.   Eyes: Negative for discharge and redness.  Respiratory: Positive for shortness of breath. Negative for cough, hemoptysis, sputum production and wheezing.   Cardiovascular: Negative for chest pain, palpitations, orthopnea, claudication, leg swelling and PND.  Gastrointestinal: Negative for heartburn, nausea, vomiting and abdominal pain.  Musculoskeletal: Negative for myalgias and falls.  Skin: Negative for rash.  Neurological: Negative for dizziness, tingling, tremors, sensory change, speech change, focal weakness and loss of consciousness.  Endo/Heme/Allergies: Does not bruise/bleed easily.  Psychiatric/Behavioral: The patient is not nervous/anxious.   All other systems reviewed and are negative.    Objective: Telemetry: Atrial flutter with variable AV conduction, 70's 90's bpm currently. Still with some episodes of atrial flutter with RVR into the 120's to 130's bpm Physical Exam: Blood pressure 133/106, pulse 110, temperature 98.5 F (36.9 C), temperature source Oral, resp. rate 16, height 5\' 5"  (1.651 m), weight 159 lb 6.3 oz (72.3 kg), SpO2 98 %. Body mass index is 26.52 kg/(m^2). General: Frail appearing, in no acute distress. Head: Normocephalic, atraumatic, sclera non-icteric, no xanthomas, nares are without discharge. Neck: Negative for carotid bruits. JVP not elevated. Trach  present. Lungs: Clear bilaterally to auscultation without wheezes, rales, or rhonchi. Breathing is unlabored. Heart: Irregular S1 S2 without murmurs, rubs, or gallops.  Abdomen: Soft, non-tender, non-distended with normoactive bowel sounds. No rebound/guarding. Extremities: No clubbing or cyanosis. No edema. Distal pedal pulses are 2+ and equal bilaterally. Neuro: Alert and oriented X 3. Moves all extremities spontaneously. Psych:  Responds to questions appropriately with a normal affect.   Intake/Output Summary (Last 24 hours) at 04/01/16 1043 Last data filed at 04/01/16 0800  Gross per 24 hour  Intake   2120 ml  Output    450 ml  Net   1670 ml    Inpatient Medications:  . enoxaparin (LOVENOX) injection  1 mg/kg Subcutaneous Q12H  . insulin aspart  0-15 Units Subcutaneous TID WC  . insulin aspart  0-5 Units Subcutaneous QHS  . levothyroxine  150 mcg Oral QAC breakfast  . magnesium oxide  400 mg Oral Daily  . metoprolol tartrate  12.5 mg Oral BID  . omega-3 acid ethyl esters  1 g Oral QPM  . rosuvastatin  40 mg Oral QPM  . sotalol  80 mg Oral Q12H   Infusions:  . 0.9 % NaCl with KCl 20 mEq / L 50 mL/hr at 04/01/16 0800    Labs:  Recent Labs  03/31/16 0504 04/01/16 0352  NA 135 139  K 4.3 4.4  CL 102 106  CO2 28 27  GLUCOSE 75 93  BUN 8 9  CREATININE 0.77 0.69  CALCIUM 8.5* 8.8*  MG 1.3* 1.6*   No results for input(s): AST, ALT, ALKPHOS, BILITOT, PROT, ALBUMIN in the last 72 hours.  Recent Labs  03/30/16 1815 03/31/16 0504  WBC 6.3 4.6  NEUTROABS 3.3  --   HGB 12.3* 12.3*  HCT 36.3*  35.3*  MCV 95.9 95.6  PLT 181 175   No results for input(s): CKTOTAL, CKMB, TROPONINI in the last 72 hours. Invalid input(s): POCBNP  Recent Labs  03/31/16 0504  HGBA1C 6.0     Weights: Filed Weights   03/30/16 1757 03/30/16 2325  Weight: 185 lb (83.915 kg) 159 lb 6.3 oz (72.3 kg)     Radiology/Studies:  Dg Chest 2 View  03/30/2016  MPRESSION: Low lung volumes.   No active cardiopulmonary disease. Electronically Signed   By: Earle Gell M.D.   On: 03/30/2016 18:46     Assessment and Plan  Principal Problem:   Atrial flutter with rapid ventricular response (HCC) Active Problems:   Hypotension   CAD (coronary artery disease)   Diabetes mellitus (HCC)   H/O: CVA (cerebrovascular accident)   Dyslipidemia   HTN (hypertension)   Throat cancer ([place])    1. Typical atrial flutter with RVR: -Tolerating well -Reportedly he has been in this rhythm with a tachycardic heart rate since at least November 2016 on sotalol only -Never on long term, full-dose anticoagulation as an outpatient -He has been out of his sotalol for several weeks prior to his admission and was unable to get a refill from his primary cardiologist with an OV (OV was scheduled for 04/14/2016) -Initially hypotensive on 5/31, BP now improved to the Q000111Q to 123XX123 systolic -Given improve BPs will increase Lopressor with hold parameters -Hold digoxin at this time given digoxin level of 1.0 this morning -He has not been adequately anticoagulated upon admission which precludes the usage of amiodarone at this time -Continue sotalol 80 mg bid, may need to titrate pending HR throughout the day -Given that he was hemodynamically stable upon initial cardiology consult along with hypotension, the only option for rate control at that time was digoxin IV load followed by PO today. Digoxin held as above -On full-dose Lovenox for anticoagulation given his elevated CHADS2VASc of 6 (HTN, age, DM, stroke x 2, vascular disease) -Ultimately he will need Eliquis or Xarelto with the cessation of aspirin  -Given that he has reportedly been in this rhythm since the fall of 2016 there is concern for a tachy-mediated cardiomyopathy, perhaps on top of ICM given the patient's history -Will need to check an echo to evaluate LVSF, wall motion, and right-sided pressure once he is rate controlled -If he becomes  hemodynamically unstable will need to pursue emergent DCCV, even though he has not been adequately anticoagulated  -Options in the future include possible rate control with planned DCCV either inpatient (though may be less inclined to do this given history of XRT to the throat) or outpatient depending on how his heart rate improves and how his symptoms improve -Long term therapy of atrial flutter would be with an ablation from EP, could be done as an outpatient  -Primary team to continue to evaluate for infections playing a role in the above  2. CAD s/p CABG as above: -No symptoms concerning for ischemia at this time -Check echo as above -With the addition of full-dose Lovenox as above will discontinue aspirin and Plavix for now -He will likely need to resume Plavix prior to discharge along with DOAC -Lopressor as above -Depending on EF, may need to change to Coreg long term  3. Hypotension: -Improved -Discontinue losartan in an effort to gain improved BP  -Concerns for cardiomyopathy in the setting of his atrial flutter leading to low BP -Restoration of sinus may aid in improving his BP  4.  Stage 4 throat cancer: -S/p tracheostomy and XRT -Per primary team  5. History of stroke x 2 in 1995: -S/p stenting to LICA -As above, will need DOAC and possibly Plavix  6. HLD: -Crestor -LDL 31  7. DM: -Per primary team -A1C 6.0%  8. Malnutrition: -Could supplement with Boost/Ensure -Consider nutrition evaluation   9. Hypothyroidism:  -In the setting of cancer as above -On repletion as above  10. Hypomagnesemia: -Status post IV load x 1 -Start PO magnesium oxide this morning   11. Dispo: -His heart rate is much improved and he is tolerating rhythm well -Should be of for transfer to 2A with continued monitoring and titration of medications   Signed, Christell Faith, PA-C Pager: [phone] 04/01/2016, 10:43 AM

## 2016-04-01 NOTE — Progress Notes (Signed)
PARENTERAL NUTRITION CONSULT NOTE - INITIAL  Pharmacy Consult for Electrolyte Monitoring  No Known Allergies  Patient Measurements: Height: 5\' 5"  (165.1 cm) Weight: 159 lb 6.3 oz (72.3 kg) IBW/kg (Calculated) : 61.5  Vital Signs: Temp: 98.5 F (36.9 C) (06/01 0800) Temp Source: Oral (06/01 0800) BP: 119/76 mmHg (06/01 1317) Pulse Rate: 77 (06/01 1317) Intake/Output from previous day: 05/31 0701 - 06/01 0700 In: 2560 [P.O.:960; I.V.:1550; IV Piggyback:50] Out: 600 [Urine:600] Intake/Output from this shift: Total I/O In: 100 [I.V.:100] Out: -   Labs:  Recent Labs  03/30/16 1815 03/31/16 0504  WBC 6.3 4.6  HGB 12.3* 12.3*  HCT 36.3* 35.3*  PLT 181 175     Recent Labs  03/30/16 1815 03/31/16 0504 04/01/16 0352  NA 125* 135 139  K 3.9 4.3 4.4  CL 89* 102 106  CO2 21* 28 27  GLUCOSE 100* 75 93  BUN 11 8 9   CREATININE 0.87 0.77 0.69  CALCIUM 8.8* 8.5* 8.8*  MG  --  1.3* 1.6*  TRIG  --  51  --   CHOLHDL  --  1.7  --   CHOL  --  101  --    Estimated Creatinine Clearance: 70.5 mL/min (by C-G formula based on Cr of 0.69).    Recent Labs  03/31/16 2117 04/01/16 0748 04/01/16 1116  GLUCAP 120* 114* 230*    Medical History: Past Medical History  Diagnosis Date  . Diabetes mellitus ([place])   . Chronic back pain   . Hypertension   . Stroke [facility])     a. x 2 in 1995  . Hyperlipidemia   . CAD (coronary artery disease)     a. s/p 4 vessel CABG in 2007, no interventions since  . Atrial flutter ([place])     a. originally diagnosed 09/2015; b. not on full-dose anticoagulation coming into the hospital; c. CHADS2VASc at least 6 (HTN, age, DM, stroke x 2, vascular disease)  . 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 s/p tracheostmy and XRT in 2007  . Carotid artery disease ([place])     a. s/p left-sided stenting in 1995    Medications:  Scheduled:  . digoxin  0.125 mg Oral Daily  . insulin aspart  0-15 Units Subcutaneous TID  WC  . insulin aspart  0-5 Units Subcutaneous QHS  . levothyroxine  150 mcg Oral QAC breakfast  . magnesium oxide  400 mg Oral Daily  . magnesium sulfate 1 - 4 g bolus IVPB  2 g Intravenous Once  . metoprolol tartrate  25 mg Oral BID  . omega-3 acid ethyl esters  1 g Oral QPM  . rivaroxaban  20 mg Oral Q supper  . rosuvastatin  40 mg Oral QPM  . sotalol  80 mg Oral Q12H   Infusions:  . 0.9 % NaCl with KCl 20 mEq / L 50 mL/hr at 04/01/16 0800    Assessment: Pharmacy consulted to assist in managing electrolytes in this 75 y/o M with atrial flutter.   Plan:  Magnesium replaced with 2 g mag sulfate iv x 1. Will f/u AM labs.   Ulice Dash D 04/01/2016,3:06 PM

## 2016-04-01 NOTE — Progress Notes (Signed)
[place] [facility] [place] NAME: Mitchell Huffman    MR#:  [id number]  DATE OF BIRTH:  1941-08-16  SUBJECTIVE:   Patient wants to go home. Patient remains in flutter with heart variable heart rates.   REVIEW OF SYSTEMS:    Review of Systems  Constitutional: Positive for malaise/fatigue. Negative for fever and chills.  HENT: Negative for ear discharge, ear pain, hearing loss, nosebleeds and sore throat.   Eyes: Negative for blurred vision and pain.  Respiratory: Positive for shortness of breath. Negative for cough, hemoptysis and wheezing.   Cardiovascular: Negative for chest pain, palpitations and leg swelling.  Gastrointestinal: Negative for nausea, vomiting, abdominal pain, diarrhea and blood in stool.  Genitourinary: Negative for dysuria.  Musculoskeletal: Negative for back pain.  Neurological: Negative for dizziness, tremors, speech change, focal weakness, seizures and headaches.  Endo/Heme/Allergies: Does not bruise/bleed easily.  Psychiatric/Behavioral: Negative for depression, suicidal ideas and hallucinations.    Tolerating Diet:yes      DRUG ALLERGIES:  No Known Allergies  VITALS:  Blood pressure 133/106, pulse 110, temperature 98.5 F (36.9 C), temperature source Oral, resp. rate 16, height 5\' 5"  (1.651 m), weight 72.3 kg (159 lb 6.3 oz), SpO2 98 %.  PHYSICAL EXAMINATION:   Physical Exam  Constitutional: He is oriented to person, place, and time and well-developed, well-nourished, and in no distress. No distress.  HENT:  Head: Normocephalic.  Eyes: No scleral icterus.  Neck: Normal range of motion. Neck supple. No JVD present. No tracheal deviation present.  Cardiovascular: Normal rate and regular rhythm.  Exam reveals no gallop and no friction rub.   Murmur heard. Irr, irr tachy  Pulmonary/Chest: Effort normal and breath sounds normal. No respiratory distress. He has no wheezes. He has no rales. He exhibits no tenderness.   Abdominal: Soft. Bowel sounds are normal. He exhibits no distension and no mass. There is no tenderness. There is no rebound and no guarding.  Musculoskeletal: Normal range of motion. He exhibits no edema.  Neurological: He is alert and oriented to person, place, and time.  Skin: Skin is warm. No rash noted. No erythema.  Psychiatric: Affect and judgment normal.      LABORATORY PANEL:   CBC  Recent Labs Lab 03/31/16 0504  WBC 4.6  HGB 12.3*  HCT 35.3*  PLT 175   ------------------------------------------------------------------------------------------------------------------  Chemistries   Recent Labs Lab 04/01/16 0352  NA 139  K 4.4  CL 106  CO2 27  GLUCOSE 93  BUN 9  CREATININE 0.69  CALCIUM 8.8*  MG 1.6*   ------------------------------------------------------------------------------------------------------------------  Cardiac Enzymes No results for input(s): TROPONINI in the last 168 hours. ------------------------------------------------------------------------------------------------------------------  RADIOLOGY:  Dg Chest 2 View  03/30/2016  CLINICAL DATA:  COUGH, SHORTNESS BREATH, AND CHEST CONGESTION FOR 1 MONTH. CORONARY ARTERY DISEASE. EXAM: CHEST  2 VIEW COMPARISON:  05/28/2015 FINDINGS: Low lung volumes are again demonstrated. Heart size is stable and probably within normal limits allowing for low lung volumes and AP view. No evidence of pulmonary infiltrate or edema. No evidence of pneumothorax or pleural effusion. Prior CABG noted. IMPRESSION: Low lung volumes.  No active cardiopulmonary disease. Electronically Signed   By: Earle Gell M.D.   On: 03/30/2016 18:46     ASSESSMENT AND PLAN:   75 year old male with a history of CAD/CABG, CVA, stage IV throat cancer, a flutter and diabetes who presented with weakness  1. Atrial flutter with RVR: Appreciate cardiology consult. Continue sotalol 80 mg twice a  day and metoprolol Continue full dose  Lovenox and he will ultimately need long-term anticoagulation Follow up on echo  2. Hyponatremia: This is improved  3. Lactic acidosis: Metformin is been stopped.  4. Hypothyroidism: Continue Synthroid TSH was within normal limits  5. CAD: Continue metoprolol and Crestor.  6. History CVA: Continue aspirin and statin  Management plans discussed with the patient and wife and he is in agreement.  CODE STATUS: FULL  TOTAL TIME TAKING CARE OF THIS PATIENT: 25 minutes.     POSSIBLE D/C 1-2 days, DEPENDING ON CLINICAL CONDITION.   Mitchell Huffman M.D on 04/01/2016 at 12:11 PM  Between 7am to 6pm - Pager - [phone] After 6pm go to [email or web address] - password [email or web address] [facility]  Office  [phone]  CC: Primary care physician; Volanda Napoleon, MD  Note: This dictation was prepared with Dragon dictation along with smaller phrase technology. Any transcriptional errors that result from this process are unintentional.

## 2016-04-01 NOTE — Progress Notes (Signed)
Report called to receiving nurse on 2A. Pt in stable condition. All belongings with pt.

## 2016-04-01 NOTE — Progress Notes (Signed)
ANTICOAGULATION CONSULT NOTE - Initial Consult  Pharmacy Consult for Rivaroxaban Indication: Atrial flutter  No Known Allergies  Patient Measurements: Height: 5\' 5"  (165.1 cm) Weight: 159 lb 6.3 oz (72.3 kg) IBW/kg (Calculated) : 61.5  Vital Signs: Temp: 98.5 F (36.9 C) (06/01 0800) Temp Source: Oral (06/01 0800) BP: 119/76 mmHg (06/01 1317) Pulse Rate: 77 (06/01 1317)  Labs:  Recent Labs  03/30/16 1815 03/31/16 0504 04/01/16 0352  HGB 12.3* 12.3*  --   HCT 36.3* 35.3*  --   PLT 181 175  --   CREATININE 0.87 0.77 0.69    Estimated Creatinine Clearance: 70.5 mL/min (by C-G formula based on Cr of 0.69).   Medical History: Past Medical History  Diagnosis Date  . Diabetes mellitus ([place])   . Chronic back pain   . Hypertension   . Stroke [facility])     a. x 2 in 1995  . Hyperlipidemia   . CAD (coronary artery disease)     a. s/p 4 vessel CABG in 2007, no interventions since  . Atrial flutter ([place])     a. originally diagnosed 09/2015; b. not on full-dose anticoagulation coming into the hospital; c. CHADS2VASc at least 6 (HTN, age, DM, stroke x 2, vascular disease)  . 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 s/p tracheostmy and XRT in 2007  . Carotid artery disease ([place])     a. s/p left-sided stenting in 1995    Medications:  Scheduled:  . digoxin  0.125 mg Oral Daily  . insulin aspart  0-15 Units Subcutaneous TID WC  . insulin aspart  0-5 Units Subcutaneous QHS  . levothyroxine  150 mcg Oral QAC breakfast  . magnesium oxide  400 mg Oral Daily  . magnesium sulfate 1 - 4 g bolus IVPB  2 g Intravenous Once  . metoprolol tartrate  25 mg Oral BID  . omega-3 acid ethyl esters  1 g Oral QPM  . rivaroxaban  20 mg Oral Q supper  . rosuvastatin  40 mg Oral QPM  . sotalol  80 mg Oral Q12H   Infusions:  . 0.9 % NaCl with KCl 20 mEq / L 50 mL/hr at 04/01/16 0800    Assessment: 75 y/o M with aflutter on LMWH to convert to  rivaroxaban. Last dose or LMWH at 1322.   Plan:  Will begin rivaroxaban 20 mg daily tonight at 2200 then daily with supper thereafter.   Ulice Dash D 04/01/2016,3:02 PM

## 2016-04-02 LAB — BASIC METABOLIC PANEL
ANION GAP: 9 (ref 5–15)
BUN: 11 mg/dL (ref 6–20)
CHLORIDE: 102 mmol/L (ref 101–111)
CO2: 28 mmol/L (ref 22–32)
Calcium: 9.2 mg/dL (ref 8.9–10.3)
Creatinine, Ser: 0.75 mg/dL (ref 0.61–1.24)
GFR calc non Af Amer: >60 mL/min (ref >60)
Glucose, Bld: 92 mg/dL (ref 65–99)
POTASSIUM: 4.4 mmol/L (ref 3.5–5.1)
Sodium: 139 mmol/L (ref 135–145)

## 2016-04-02 LAB — CBC
HCT: 39.1 % — ABNORMAL LOW (ref 40.0–52.0)
HEMOGLOBIN: 13.4 g/dL (ref 13.0–18.0)
MCH: 32.8 pg (ref 26.0–34.0)
MCHC: 34.1 g/dL (ref 32.0–36.0)
MCV: 96.1 fL (ref 80.0–100.0)
Platelets: 180 10*3/uL (ref 150–440)
RBC: 4.07 MIL/uL — AB (ref 4.40–5.90)
RDW: 13.1 % (ref 11.5–14.5)
WBC: 6.5 10*3/uL (ref 3.8–10.6)

## 2016-04-02 LAB — GLUCOSE, CAPILLARY
GLUCOSE-CAPILLARY: 161 mg/dL — AB (ref 65–99)
GLUCOSE-CAPILLARY: 146 mg/dL — AB (ref 65–99)
GLUCOSE-CAPILLARY: 102 mg/dL — AB (ref 65–99)
Glucose-Capillary: 78 mg/dL (ref 65–99)
Glucose-Capillary: 127 mg/dL — ABNORMAL HIGH (ref 65–99)

## 2016-04-02 LAB — MAGNESIUM: Magnesium: 1.8 mg/dL (ref 1.7–2.4)

## 2016-04-02 MED ORDER — DOCUSATE SODIUM 100 MG PO CAPS
100.0000 mg | ORAL_CAPSULE | Freq: Two times a day (BID) | ORAL | Status: DC
Start: 2016-04-02 — End: 2016-04-06
  Administered 2016-04-02 – 2016-04-06 (×9): 100 mg via ORAL
  Filled 2016-04-02 (×9): qty 1

## 2016-04-02 MED ORDER — DIGOXIN 125 MCG PO TABS
0.1250 mg | ORAL_TABLET | Freq: Every day | ORAL | Status: DC
  Administered 2016-04-02 – 2016-04-06 (×5): 0.125 mg via ORAL
  Filled 2016-04-02 (×5): qty 1

## 2016-04-02 MED ORDER — METOPROLOL TARTRATE 50 MG PO TABS
75.0000 mg | ORAL_TABLET | Freq: Two times a day (BID) | ORAL | Status: DC
  Administered 2016-04-02 (×2): 75 mg via ORAL
  Filled 2016-04-02 (×2): qty 1

## 2016-04-02 MED ORDER — POLYETHYLENE GLYCOL 3350 17 G PO PACK
17.0000 g | PACK | Freq: Every day | ORAL | Status: DC
  Administered 2016-04-02 – 2016-04-05 (×2): 17 g via ORAL
  Filled 2016-04-02 (×3): qty 1

## 2016-04-02 NOTE — Progress Notes (Signed)
Patient: Mitchell Huffman / Admit Date: 03/30/2016 / Date of Encounter: 04/02/2016, 8:50 AM   Subjective: Up eating breakfast this morning. No acute overnight events. Echo showed EF 30-35%, diffuse HK, moderate to severe AS, mild MR, dilated RV with mildly reduced systolic function. No SOB this morning. No chest pain or palpitations.   Review of Systems: Review of Systems  Constitutional: Positive for weight loss and malaise/fatigue. Negative for fever, chills and diaphoresis.  HENT: Negative for congestion.   Eyes: Negative for discharge and redness.  Respiratory: Positive for cough and shortness of breath. Negative for hemoptysis, sputum production and wheezing.   Cardiovascular: Negative for chest pain, palpitations, orthopnea, claudication, leg swelling and PND.  Gastrointestinal: Negative for nausea, vomiting and abdominal pain.  Musculoskeletal: Negative for myalgias and falls.  Skin: Negative for rash.  Neurological: Positive for weakness. Negative for dizziness, sensory change, speech change, focal weakness and loss of consciousness.  Endo/Heme/Allergies: Does not bruise/bleed easily.  Psychiatric/Behavioral: The patient is not nervous/anxious.   All other systems reviewed and are negative.   Objective: Telemetry: Atrial flutter with variable AV conduction with heart rates ranging from the 60's to 120's bpm Physical Exam: Blood pressure 135/95, pulse 128, temperature 98.1 F (36.7 C), temperature source Oral, resp. rate 18, height 5\' 5"  (1.651 m), weight 159 lb 6.3 oz (72.3 kg), SpO2 96 %. Body mass index is 26.52 kg/(m^2). General: Well developed, well nourished, in no acute distress. Head: Normocephalic, atraumatic, sclera non-icteric, no xanthomas, nares are without discharge. Neck: Negative for carotid bruits. JVP not elevated. Trach noted.  Lungs: Clear bilaterally to auscultation without wheezes, rales, or rhonchi. Breathing is unlabored. Heart: irregular, S1 S2 without  murmurs, rubs, or gallops.  Abdomen: Soft, non-tender, non-distended with normoactive bowel sounds. No rebound/guarding. Extremities: No clubbing or cyanosis. No edema. Distal pedal pulses are 2+ and equal bilaterally. Neuro: Alert and oriented X 3. Moves all extremities spontaneously. Psych:  Responds to questions appropriately with a normal affect.   Intake/Output Summary (Last 24 hours) at 04/02/16 0850 Last data filed at 04/02/16 0338  Gross per 24 hour  Intake    350 ml  Output    650 ml  Net   -300 ml    Inpatient Medications:  . digoxin  0.125 mg Oral Daily  . insulin aspart  0-15 Units Subcutaneous TID WC  . insulin aspart  0-5 Units Subcutaneous QHS  . levothyroxine  150 mcg Oral QAC breakfast  . magnesium oxide  400 mg Oral Daily  . metoprolol tartrate  25 mg Oral BID  . omega-3 acid ethyl esters  1 g Oral QPM  . rivaroxaban  20 mg Oral Q supper  . rosuvastatin  40 mg Oral QPM  . sotalol  80 mg Oral Q12H   Infusions:  . 0.9 % NaCl with KCl 20 mEq / L 50 mL/hr at 04/01/16 1543    Labs:  Recent Labs  04/01/16 0352 04/02/16 0440  NA 139 139  K 4.4 4.4  CL 106 102  CO2 27 28  GLUCOSE 93 92  BUN 9 11  CREATININE 0.69 0.75  CALCIUM 8.8* 9.2  MG 1.6* 1.8   No results for input(s): AST, ALT, ALKPHOS, BILITOT, PROT, ALBUMIN in the last 72 hours.  Recent Labs  03/30/16 1815 03/31/16 0504 04/02/16 0440  WBC 6.3 4.6 6.5  NEUTROABS 3.3  --   --   HGB 12.3* 12.3* 13.4  HCT 36.3* 35.3* 39.1*  MCV 95.9 95.6  96.1  PLT 181 175 180   No results for input(s): CKTOTAL, CKMB, TROPONINI in the last 72 hours. Invalid input(s): POCBNP  Recent Labs  03/31/16 0504  HGBA1C 6.0     Weights: Filed Weights   03/30/16 1757 03/30/16 2325  Weight: 185 lb (83.915 kg) 159 lb 6.3 oz (72.3 kg)     Radiology/Studies:  Dg Chest 2 View  03/30/2016  CLINICAL DATA:  COUGH, SHORTNESS BREATH, AND CHEST CONGESTION FOR 1 MONTH. CORONARY ARTERY DISEASE. EXAM: CHEST  2 VIEW  COMPARISON:  05/28/2015 FINDINGS: Low lung volumes are again demonstrated. Heart size is stable and probably within normal limits allowing for low lung volumes and AP view. No evidence of pulmonary infiltrate or edema. No evidence of pneumothorax or pleural effusion. Prior CABG noted. IMPRESSION: Low lung volumes.  No active cardiopulmonary disease. Electronically Signed   By: Earle Gell M.D.   On: 03/30/2016 18:46     Assessment and Plan  Principal Problem:   Atrial flutter with rapid ventricular response (HCC) Active Problems:   Hypotension   CAD (coronary artery disease)   Diabetes mellitus (HCC)   H/O: CVA (cerebrovascular accident)   Dyslipidemia   HTN (hypertension)   Throat cancer ([place])    1. Typical atrial flutter with RVR: -Tolerating well -Reportedly he has been in this rhythm with a tachycardic heart rate since at least November 2016 on sotalol only -Never on long term, full-dose anticoagulation as an outpatient -He has been out of his sotalol for several weeks prior to his admission and was unable to get a refill from his primary cardiologist with an OV (OV was scheduled for 04/14/2016) -Initially hypotensive on 5/31, BP now improved to the [id number] to [id number] systolic -Given improve BPs will increase Lopressor with hold parameters -Continue to hold digoxin at this time given digoxin level of 1.0 this morning -He has not been adequately anticoagulated upon admission which precludes the usage of amiodarone at this time -Continue sotalol 80 mg bid for now, could consider changing to amiodarone as an outpatient given sotalol does not seem to be as effective  -Given that he was hemodynamically stable upon initial cardiology consult along with hypotension, the only option for rate control at that time was digoxin IV load followed by PO today. 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% on echo 04/01/2016, perhaps on top of ICM given the patient's history -If he becomes hemodynamically unstable will need to pursue emergent DCCV, even though he has not been adequately anticoagulated  -Options in the future include possible rate control with planned DCCV either inpatient (though may be less inclined to do this given history of XRT to the throat) or outpatient depending on how his heart rate improves and how his symptoms improve -Long term therapy of atrial flutter would be with an ablation from EP, could be done as an outpatient  -Primary team to continue to evaluate for infections playing a role in the above  2. Chronic systolic CHF: -EF [id number], diffuse hypokinesis, RWMA could not be excluded, severe AS by estimated valve area of 0.63cm^2 (difficult to determine in the setting of moderate to severely depressed EF. Visually, AS appeared to be moderate, possibly moderate to severe. Mild MR, RV  cavity was dilated with mildly reduced systolic function. RA mildly dilated.  -Will need repeat echo one month s/p restoration of sinus rhythm to assess for improvement in LVSF. If LVSF remains depressed will need ischemic evaluation with cardiac catheterization given the above echo findings -For now, continue Lopressor (needed for rate control over Coreg) -Start low-dose lisinopril 2.5 mg daily given cardiomyopathy -May need low-dose Lasix at discharge   3. CAD s/p CABG as above: -No symptoms concerning for ischemia at this time -Echo as above -Will need repeat echo as above with possible ischemic evaluation depending on how EF trends -With the addition of full-dose anticoagulation as above will discontinue aspirin -May need Plavix at discharge  -Lopressor as above  3. Hypotension: -Improved -Discontinuation of losartan  improved BP  -Cardiomyopathy and underlying atrial flutter may be contributing to his soft BPs at times -Restoration of sinus may aid in improving his BP  4. Stage 4 throat cancer: -S/p tracheostomy and XRT -Would be difficult to perform TEE given his history of XRT, thus will move forward with rate control and DCCV in 3 weeks as above -Per primary team  5. History of stroke x 2 in 1995: -S/p stenting to LICA -As above, will need DOAC and possibly Plavix  6. HLD: -Crestor -LDL 31  7. DM: -Per primary team -A1C 6.0%  8. Malnutrition: -Could supplement with Boost/Ensure -Consider nutrition evaluation   9. Hypothyroidism:  -In the setting of cancer as above -On repletion as above  10. Hypomagnesemia: -Status post IV load x 1 -Start PO magnesium oxide this morning   11. Aortic stenosis: -Will need repeat imaging once/if EF improves -As above, TEE would be at significant risk given his history of XRT to the throat -Medical management my be best given his comorbidities    Signed, Christell Faith, PA-C Pager: [phone] 04/02/2016, 8:50 AM

## 2016-04-02 NOTE — Progress Notes (Signed)
PARENTERAL NUTRITION CONSULT NOTE - INITIAL  Pharmacy Consult for Electrolyte Monitoring  No Known Allergies   Labs:  Recent Labs  03/30/16 1815 03/31/16 0504 04/02/16 0440  WBC 6.3 4.6 6.5  HGB 12.3* 12.3* 13.4  HCT 36.3* 35.3* 39.1*  PLT 181 175 180     Recent Labs  03/31/16 0504 04/01/16 0352 04/02/16 0440  NA 135 139 139  K 4.3 4.4 4.4  CL 102 106 102  CO2 28 27 28   GLUCOSE 75 93 92  BUN 8 9 11   CREATININE 0.77 0.69 0.75  CALCIUM 8.5* 8.8* 9.2  MG 1.3* 1.6* 1.8  TRIG 51  --   --   CHOLHDL 1.7  --   --   CHOL 101  --   --    Estimated Creatinine Clearance: 70.5 mL/min (by Huffman-G formula based on Cr of 0.75).    Recent Labs  04/01/16 2103 04/02/16 0726 04/02/16 0843  GLUCAP 218* 78 127*   Infusions:  . 0.9 % NaCl with KCl 20 mEq / L 50 mL/hr at 04/01/16 1543    Assessment: Pharmacy consulted to assist in managing electrolytes in this 75 y/o M with atrial flutter.   Plan:  Electrolytes WNL, continued on NS with 58mEq K at 68ml/hr, mag ox 400mg  PO daily. Will additional supplementation needed. Will recheck with AM labs  Mitchell Huffman 04/02/2016,10:18 AM

## 2016-04-02 NOTE — Progress Notes (Signed)
[facility] [place] NAME: Deforrest Hickingbottom    MR#:  [id number]  DATE OF BIRTH:  02/07/1941  SUBJECTIVE:    No acute issues overnight Patient able to answer yes no questions.  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 The patient due to previous laryngeal cancer and radiation  REVIEW OF SYSTEMS:    Review of Systems  Constitutional: Positive for malaise/fatigue. Negative for fever and chills.  HENT: Negative for ear discharge, ear pain, hearing loss, nosebleeds and sore throat.   Eyes: Negative for blurred vision and pain.  Respiratory: Negative for cough, hemoptysis and wheezing.   Cardiovascular: Negative for chest pain, palpitations and leg swelling.  Gastrointestinal: Negative for nausea, vomiting, abdominal pain, diarrhea and blood in stool.  Genitourinary: Negative for dysuria.  Musculoskeletal: Negative for back pain.  Neurological: Negative for dizziness, tremors, speech change, focal weakness, seizures and headaches.  Endo/Heme/Allergies: Does not bruise/bleed easily.  Psychiatric/Behavioral: Negative for depression, suicidal ideas and hallucinations.    Tolerating Diet:yes      DRUG ALLERGIES:  No Known Allergies  VITALS:  Blood pressure 108/54, pulse 61, temperature 98.5 F (36.9 C), temperature source Oral, resp. rate 17, height 5\' 5"  (1.651 m), weight 72.3 kg (159 lb 6.3 oz), SpO2 98 %.  PHYSICAL EXAMINATION:   Physical Exam  Constitutional: He is oriented to person, place, and time and well-developed, well-nourished, and in no distress. No distress.  HENT:  Head: Normocephalic.  Eyes: No scleral icterus.  Neck: Normal range of motion. Neck supple. No JVD present. No tracheal deviation present.  Cardiovascular: Normal rate.  Exam reveals no gallop and no friction rub.   Murmur heard. Irr,    Pulmonary/Chest: Effort normal and breath sounds normal. No respiratory distress. He has no wheezes. He has no rales. He exhibits no  tenderness.  Abdominal: Soft. Bowel sounds are normal. He exhibits no distension and no mass. There is no tenderness. There is no rebound and no guarding.  Musculoskeletal: Normal range of motion. He exhibits no edema.  Neurological: He is alert and oriented to person, place, and time.  Skin: Skin is warm. No rash noted. No erythema.  Psychiatric: Affect and judgment normal.      LABORATORY PANEL:   CBC  Recent Labs Lab 04/02/16 0440  WBC 6.5  HGB 13.4  HCT 39.1*  PLT 180   ------------------------------------------------------------------------------------------------------------------  Chemistries   Recent Labs Lab 04/02/16 0440  NA 139  K 4.4  CL 102  CO2 28  GLUCOSE 92  BUN 11  CREATININE 0.75  CALCIUM 9.2  MG 1.8   ------------------------------------------------------------------------------------------------------------------  Cardiac Enzymes No results for input(s): TROPONINI in the last 168 hours. ------------------------------------------------------------------------------------------------------------------  RADIOLOGY:  No results found.   ASSESSMENT AND PLAN:   75 year old male with a history of CAD/CABG, CVA, stage IV throat cancer, a flutter and diabetes who presented with weakness  1. Atrial flutter with RVR: Appreciate cardiology consult. Due to severely reduced LV function of 25-30% sotalol has been discontinued by cardiology . He will continue on metoprolol and digoxin  and XARELTO.  ECHO: Left ventricle: The cavity size was normal. Systolic function was  moderately to severely reduced. The estimated ejection fraction  was in the range of 30% to 35%. Diffuse hypokinesis. Regional  wall motion abnormalities cannot be excluded. The study is not  technically sufficient to allow evaluation of LV diastolic  function. - Aortic valve: Marland Kitchen Severe aortic valve stenosis by  estimated Valve area (VTI): 0.63 cm^2.  Difficult to determine  in  the setting of moderate to severely depressed EF. Visually, AS  appears to be moderate, possibly moderate to severe. - Mitral valve: There was mild regurgitation.  2. Hyponatremia: This has improved  3. Lactic acidosis: Metformin is been stopped.  4. Hypothyroidism: Continue Synthroid TSH was within normal limits  5. CAD: Continue metoprolol and Crestor.  6. History CVA: Continue aspirin and statin  Management plans discussed with the patient  and he is in agreement.  CODE STATUS: FULL  TOTAL TIME TAKING CARE OF THIS PATIENT: 25 minutes.     POSSIBLE D/C tomorrow to home, DEPENDING ON CLINICAL CONDITION.   Xzandria Clevinger M.D on 04/02/2016 at 12:35 PM  Between 7am to 6pm - Pager - [phone] After 6pm go to [email or web address] - password [email or web address] [facility]  Office  [phone]  CC: Primary care physician; Volanda Napoleon, MD  Note: This dictation was prepared with Dragon dictation along with smaller phrase technology. Any transcriptional errors that result from this process are unintentional.

## 2016-04-02 NOTE — Progress Notes (Signed)
Chaplain round the unit and assisted the patient in completing Advance Directive/Living Will. Original given to patient and copy placed in chart. Minerva Fester [phone]

## 2016-04-03 DIAGNOSIS — I350 Nonrheumatic aortic (valve) stenosis: Secondary | ICD-10-CM

## 2016-04-03 DIAGNOSIS — I5022 Chronic systolic (congestive) heart failure: Secondary | ICD-10-CM

## 2016-04-03 LAB — BASIC METABOLIC PANEL
ANION GAP: 7 (ref 5–15)
BUN: 14 mg/dL (ref 6–20)
CALCIUM: 9.6 mg/dL (ref 8.9–10.3)
CO2: 28 mmol/L (ref 22–32)
CREATININE: 0.78 mg/dL (ref 0.61–1.24)
Chloride: 102 mmol/L (ref 101–111)
GFR calc Af Amer: >60 mL/min (ref >60)
GLUCOSE: 116 mg/dL — AB (ref 65–99)
Potassium: 4.3 mmol/L (ref 3.5–5.1)
Sodium: 137 mmol/L (ref 135–145)

## 2016-04-03 LAB — GLUCOSE, CAPILLARY
GLUCOSE-CAPILLARY: 115 mg/dL — AB (ref 65–99)
GLUCOSE-CAPILLARY: 122 mg/dL — AB (ref 65–99)
Glucose-Capillary: 206 mg/dL — ABNORMAL HIGH (ref 65–99)
Glucose-Capillary: 97 mg/dL (ref 65–99)

## 2016-04-03 LAB — DIGOXIN LEVEL: Digoxin Level: 0.7 ng/mL — ABNORMAL LOW (ref 0.8–2.0)

## 2016-04-03 LAB — MAGNESIUM: Magnesium: 1.6 mg/dL — ABNORMAL LOW (ref 1.7–2.4)

## 2016-04-03 MED ORDER — DILTIAZEM HCL 25 MG/5ML IV SOLN
10.0000 mg | Freq: Once | INTRAVENOUS | Status: AC
  Administered 2016-04-03: 10 mg via INTRAVENOUS
  Filled 2016-04-03: qty 5

## 2016-04-03 MED ORDER — MAGNESIUM SULFATE 2 GM/50ML IV SOLN
2.0000 g | Freq: Once | INTRAVENOUS | Status: AC
  Administered 2016-04-03: 2 g via INTRAVENOUS
  Filled 2016-04-03: qty 50

## 2016-04-03 MED ORDER — DILTIAZEM HCL ER COATED BEADS 120 MG PO CP24
120.0000 mg | ORAL_CAPSULE | Freq: Every day | ORAL | Status: DC
  Administered 2016-04-03: 120 mg via ORAL
  Filled 2016-04-03: qty 1

## 2016-04-03 MED ORDER — METOPROLOL TARTRATE 100 MG PO TABS
100.0000 mg | ORAL_TABLET | Freq: Two times a day (BID) | ORAL | Status: DC
  Administered 2016-04-03 (×2): 100 mg via ORAL
  Filled 2016-04-03 (×2): qty 1

## 2016-04-03 MED ORDER — MAGNESIUM OXIDE 400 (241.3 MG) MG PO TABS
400.0000 mg | ORAL_TABLET | Freq: Two times a day (BID) | ORAL | Status: DC
  Administered 2016-04-03 – 2016-04-05 (×5): 400 mg via ORAL
  Filled 2016-04-03 (×4): qty 1

## 2016-04-03 NOTE — Progress Notes (Signed)
[facility] [place] NAME: Mitchell Huffman    MR#:  [id number]  DATE OF BIRTH:  02/11/1941  SUBJECTIVE:    Patient's heart rate was in the 130s to 140s last night. This morning is improved. He is currently asymptomatic Due to his radiation therapy he is unable to communicate with yes or no REVIEW OF SYSTEMS:    Review of Systems  Constitutional: Positive for malaise/fatigue. Negative for fever and chills.  HENT: Negative for ear discharge, ear pain, hearing loss, nosebleeds and sore throat.   Eyes: Negative for blurred vision and pain.  Respiratory: Negative for cough, hemoptysis and wheezing.   Cardiovascular: Negative for chest pain, palpitations and leg swelling.  Gastrointestinal: Negative for nausea, vomiting, abdominal pain, diarrhea and blood in stool.  Genitourinary: Negative for dysuria.  Musculoskeletal: Negative for back pain.  Neurological: Negative for dizziness, tremors, speech change, focal weakness, seizures and headaches.  Endo/Heme/Allergies: Does not bruise/bleed easily.  Psychiatric/Behavioral: Negative for depression, suicidal ideas and hallucinations.    Tolerating Diet:yes      DRUG ALLERGIES:  No Known Allergies  VITALS:  Blood pressure 128/71, pulse 65, temperature 98.2 F (36.8 C), temperature source Oral, resp. rate 18, height 5\' 5"  (1.651 m), weight 72.3 kg (159 lb 6.3 oz), SpO2 96 %.  PHYSICAL EXAMINATION:   Physical Exam  Constitutional: He is oriented to person, place, and time and well-developed, well-nourished, and in no distress. No distress.  HENT:  Head: Normocephalic.  Eyes: No scleral icterus.  Neck: Normal range of motion. Neck supple. No JVD present. No tracheal deviation present.  Cardiovascular: Normal rate.  Exam reveals no gallop and no friction rub.   Murmur heard. Irregular heart rhythm  Pulmonary/Chest: Effort normal and breath sounds normal. No respiratory distress. He has no  wheezes. He has no rales. He exhibits no tenderness.  Abdominal: Soft. Bowel sounds are normal. He exhibits no distension and no mass. There is no tenderness. There is no rebound and no guarding.  Musculoskeletal: Normal range of motion. He exhibits no edema.  Neurological: He is alert and oriented to person, place, and time.  Skin: Skin is warm. No rash noted. No erythema.  Psychiatric: Affect and judgment normal.      LABORATORY PANEL:   CBC  Recent Labs Lab 04/02/16 0440  WBC 6.5  HGB 13.4  HCT 39.1*  PLT 180   ------------------------------------------------------------------------------------------------------------------  Chemistries   Recent Labs Lab 04/03/16 0546  NA 137  K 4.3  CL 102  CO2 28  GLUCOSE 116*  BUN 14  CREATININE 0.78  CALCIUM 9.6  MG 1.6*   ------------------------------------------------------------------------------------------------------------------  Cardiac Enzymes No results for input(s): TROPONINI in the last 168 hours. ------------------------------------------------------------------------------------------------------------------  RADIOLOGY:  No results found.   ASSESSMENT AND PLAN:   75 year old male with a history of CAD/CABG, CVA, stage IV throat cancer, a flutter and diabetes who presented with weakness  1. Atrial flutter with RVR: Appreciate cardiology consult. Due to severely reduced LV function of 25-30% sotalol has been discontinued by cardiology . Heart rate still poorly controlled last night I will increase his metoprolol dose 100 twice a day Add cardizem cd to current regimen Likely refractory due to severe aortic stenosis and systolic dysfunction  2. Hyponatremia: This has improved  3. Severe aortic stenosis needs outpatient follow-up full evaluation for intervention  4. Hypothyroidism: Continue Synthroid TSH was within normal limits  5. CAD: Continue metoprolol and Crestor.  6. History CVA: Continue  aspirin and statin  Management plans discussed with the patient  and he is in agreement.  CODE STATUS: FULL  TOTAL TIME TAKING CARE OF THIS PATIENT: 25 minutes.     Heart rate still under poor control will adjust his medications today   Dustin Flock M.D on 04/03/2016 at 8:52 AM  Between 7am to 6pm - Pager - [phone] After 6pm go to [email or web address] - password [email or web address] [facility]  Office  [phone]  CC: Primary care physician; Volanda Napoleon, MD  Note: This dictation was prepared with Dragon dictation along with smaller phrase technology. Any transcriptional errors that result from this process are unintentional.

## 2016-04-03 NOTE — Progress Notes (Signed)
Patient: Mitchell Huffman / Admit Date: 03/30/2016 / Date of Encounter: 04/03/2016, 9:29 AM   Subjective: Patient doing well and denies chest chest pain. He says his breathing is improved. He has no complaints except for headache. Heart rate is up into the 130s, still in atrial flutter with rapid ventricular response. When asked, patient does not actually feel his heart racing. He seems to be tolerating it well with blood pressure maintaining in the normal range.  Review of Systems: Review of Systems  Constitutional: Positive for weight loss and malaise/fatigue. Negative for fever, chills and diaphoresis.  HENT: Negative for congestion.   Eyes: Negative for discharge and redness.  Respiratory: Positive for cough and shortness of breath. Negative for hemoptysis, sputum production and wheezing.   Cardiovascular: Negative for chest pain, palpitations, orthopnea, claudication, leg swelling and PND.  Gastrointestinal: Negative for nausea, vomiting and abdominal pain.  Musculoskeletal: Negative for myalgias and falls.  Skin: Negative for rash.  Neurological: Positive for weakness. Negative for dizziness, sensory change, speech change, focal weakness and loss of consciousness.  Endo/Heme/Allergies: Does not bruise/bleed easily.  Psychiatric/Behavioral: The patient is not nervous/anxious.   All other systems reviewed and are negative.   Objective: Telemetry: Atrial flutter with variable AV conduction with heart rates ranging from the 60's to 130's bpm Physical Exam: Blood pressure 126/83, pulse 134, temperature 98.2 F (36.8 C), temperature source Oral, resp. rate 18, height 5\' 5"  (1.651 m), weight 159 lb 6.3 oz (72.3 kg), SpO2 96 %. Body mass index is 26.52 kg/(m^2). General: Well developed, well nourished, in no acute distress. Head: Normocephalic, atraumatic, sclera non-icteric, no xanthomas, nares are without discharge. Neck: Negative for carotid bruits. JVP not elevated. Trach noted.    Lungs: Clear bilaterally to auscultation without wheezes, rales, or rhonchi. Breathing is unlabored. Heart: regular,normal S2 without murmurs, rubs, or gallops.  Abdomen: Soft, non-tender, non-distended with normoactive bowel sounds. No rebound/guarding. Extremities: No clubbing or cyanosis. No edema. Distal pedal pulses are 2+ and equal bilaterally. Neuro: Alert and oriented X 3. Moves all extremities spontaneously. Psych:  Responds to questions appropriately with a normal affect.   Intake/Output Summary (Last 24 hours) at 04/03/16 0929 Last data filed at 04/03/16 0417  Gross per 24 hour  Intake    480 ml  Output    650 ml  Net   -170 ml    Inpatient Medications:  . digoxin  0.125 mg Oral Daily  . diltiazem  120 mg Oral Daily  . docusate sodium  100 mg Oral BID  . insulin aspart  0-15 Units Subcutaneous TID WC  . insulin aspart  0-5 Units Subcutaneous QHS  . levothyroxine  150 mcg Oral QAC breakfast  . magnesium oxide  400 mg Oral BID  . magnesium sulfate 1 - 4 g bolus IVPB  2 g Intravenous Once  . metoprolol tartrate  100 mg Oral BID  . omega-3 acid ethyl esters  1 g Oral QPM  . polyethylene glycol  17 g Oral Daily  . rivaroxaban  20 mg Oral Q supper  . rosuvastatin  40 mg Oral QPM   Infusions:     Labs:  Recent Labs  04/02/16 0440 04/03/16 0546  NA 139 137  K 4.4 4.3  CL 102 102  CO2 28 28  GLUCOSE 92 116*  BUN 11 14  CREATININE 0.75 0.78  CALCIUM 9.2 9.6  MG 1.8 1.6*   No results for input(s): AST, ALT, ALKPHOS, BILITOT, PROT, ALBUMIN in the  last 72 hours.  Recent Labs  04/02/16 0440  WBC 6.5  HGB 13.4  HCT 39.1*  MCV 96.1  PLT 180   No results for input(s): CKTOTAL, CKMB, TROPONINI in the last 72 hours. Invalid input(s): POCBNP No results for input(s): HGBA1C in the last 72 hours.   Weights: Filed Weights   03/30/16 1757 03/30/16 2325  Weight: 185 lb (83.915 kg) 159 lb 6.3 oz (72.3 kg)     Radiology/Studies:  Dg Chest 2 View  03/30/2016   CLINICAL DATA:  COUGH, SHORTNESS BREATH, AND CHEST CONGESTION FOR 1 MONTH. CORONARY ARTERY DISEASE. EXAM: CHEST  2 VIEW COMPARISON:  05/28/2015 FINDINGS: Low lung volumes are again demonstrated. Heart size is stable and probably within normal limits allowing for low lung volumes and AP view. No evidence of pulmonary infiltrate or edema. No evidence of pneumothorax or pleural effusion. Prior CABG noted. IMPRESSION: Low lung volumes.  No active cardiopulmonary disease. Electronically Signed   By: Earle Gell M.D.   On: 03/30/2016 18:46     Assessment and Plan  Principal Problem:   Atrial flutter with rapid ventricular response (HCC) Active Problems:   Hypotension   CAD (coronary artery disease)   Diabetes mellitus (HCC)   H/O: CVA (cerebrovascular accident)   Dyslipidemia   HTN (hypertension)   Throat cancer ([place])    1. Typical atrial flutter with RVR: Tolerating well despite rapid heart rate. Blood pressure is maintained in the normal range. Patient has no symptoms of palpitations. No shortness of breath or chest pain. Continue with anticoagulation with Xarelto. Previously, he had not been on anticoagulation. Sotalol was discontinued due to depressed ejection fraction. Rate control will be difficult with atrial flutter. Fortunately, he seems to be tolerating even rapid heart rates. Ideally, he would benefit from TEE/cardioversion however, history of radiation treatment to the throat is an absolute contraindication for TEE. If he becomes hemodynamically unstable will need to pursue emergent DCCV or amiodarone loading In the meantime, continue with up titrating metoprolol and Lopressor 5mg  IV prn.  It has been elected to keep him on digoxin, recommend to continue monitoring digoxin levels. Unfortunately, diltiazem is not the best option in terms of rate control in the setting of depressed ejection fraction. Eventually, he may require outpatient DC cardioversion after adequate anticoagulation  of 4 weeks. Will also benefit from EP evaluation.   2. Chronic systolic CHF: -EF 99991111, diffuse hypokinesis Does not appear to be in acute decompensation Unable to obtain prior echocardiogram. There may be element of tachycardia induced cardiomyopathy -Will need repeat echo one month s/p restoration of sinus rhythm to assess for improvement in LVEF. If LVEF remains depressed, may need ischemic evaluation -For now, continue Lopressor Consider low-dose lisinopril 2.5 mg daily given cardiomyopathy   3. CAD s/p CABG as above: -No symptoms concerning for ischemia at this time --Consider aspirin 81 mg by mouth daily -Lopressor as above  3. Hypotension: -Improved -Discontinuation of losartan improved BP    4. Stage 4 throat cancer: -S/p tracheostomy and XRT -Would be difficult to perform TEE given his history of XRT, thus will move forward with rate control and DCCV in 3 weeks as above -Per primary team  5. History of stroke x 2 in 1995: -S/p stenting to LICA Patient now on Xarelto   6. HLD: -Crestor -LDL 31  7. DM: -Per primary team -A1C 6.0%  8. Malnutrition: -Could supplement with Boost/Ensure -Consider nutrition evaluation   9. Hypothyroidism:  -In the setting of cancer as  above -On repletion as above.  Management per primary team  10. Hypomagnesemia: Management per primary team   11. Aortic stenosis:  severe AS by estimated valve area of 0.63cm^2 (difficult to determine in the setting of moderate to severely depressed EF. Visually, AS appeared to be moderate, possibly moderate to severe. -Will need repeat imaging once rhythm is restored to sinus -As above, TEE would be at significant risk given his history of XRT to the throat -Medical management may be best given his comorbidities    Signed, Wende Bushy, MD, [place] at [facility]

## 2016-04-03 NOTE — Progress Notes (Signed)
ANTICOAGULATION CONSULT NOTE - follow up [place] for Rivaroxaban Indication: Atrial flutter  No Known Allergies  Patient Measurements: Height: 5\' 5"  (165.1 cm) Weight: 159 lb 6.3 oz (72.3 kg) IBW/kg (Calculated) : 61.5  Vital Signs: Temp: 98.2 F (36.8 C) (06/03 0417) Temp Source: Oral (06/03 0417) BP: 126/83 mmHg (06/03 0905) Pulse Rate: 134 (06/03 0905)  Labs:  Recent Labs  04/01/16 0352 04/02/16 0440 04/03/16 0546  HGB  --  13.4  --   HCT  --  39.1*  --   PLT  --  180  --   CREATININE 0.69 0.75 0.78    Estimated Creatinine Clearance: 70.5 mL/min (by C-G formula based on Cr of 0.78).   Medical History: Past Medical History  Diagnosis Date  . Diabetes mellitus ([place])   . Chronic back pain   . Hypertension   . Stroke [facility])     a. x 2 in 1995  . Hyperlipidemia   . CAD (coronary artery disease)     a. s/p 4 vessel CABG in 2007, no interventions since  . Atrial flutter ([place])     a. originally diagnosed 09/2015; b. not on full-dose anticoagulation coming into the hospital; c. CHADS2VASc at least 6 (HTN, age, DM, stroke x 2, vascular disease)  . Throat cancer ([place])     a. 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 s/p tracheostmy and XRT in 2007  . Carotid artery disease ([place])     a. s/p left-sided stenting in 1995    Medications:  Scheduled:  . digoxin  0.125 mg Oral Daily  . diltiazem  120 mg Oral Daily  . docusate sodium  100 mg Oral BID  . insulin aspart  0-15 Units Subcutaneous TID WC  . insulin aspart  0-5 Units Subcutaneous QHS  . levothyroxine  150 mcg Oral QAC breakfast  . magnesium oxide  400 mg Oral BID  . magnesium sulfate 1 - 4 g bolus IVPB  2 g Intravenous Once  . metoprolol tartrate  100 mg Oral BID  . omega-3 acid ethyl esters  1 g Oral QPM  . polyethylene glycol  17 g Oral Daily  . rivaroxaban  20 mg Oral Q supper  . rosuvastatin  40 mg Oral QPM   Infusions:     Assessment: 75 y/o M with aflutter on  LMWH(enoxaparin) to convert to rivaroxaban. Last dose or LMWH at 1322.   Plan:  Will continue rivaroxaban 20 mg  daily with supper.   Lillyahna Hemberger A 04/03/2016,9:32 AM

## 2016-04-03 NOTE — Progress Notes (Signed)
Every time the patient goes to the restroom, HR jumps to 130s and when  he sits HR goes below 100. Patient's HR this morning after restroom is sustaining at 130. Metoprolol 5 mg IV PRN administered at 0626 but no changes noted. Patient is asymptomatic. Dr. Jannifer Franklin notified with a new order for Cardizem 10 mg IV push x 1 dose. Will continue to monitor.

## 2016-04-03 NOTE — Progress Notes (Signed)
PHARMACY CONSULT NOTE - follow up  Pharmacy Consult for Electrolyte Monitoring  No Known Allergies  Patient Measurements: Height: 5\' 5"  (165.1 cm) Weight: 159 lb 6.3 oz (72.3 kg) IBW/kg (Calculated) : 61.5  Vital Signs: Temp: 98.2 F (36.8 C) (06/03 0417) Temp Source: Oral (06/03 0417) BP: 126/83 mmHg (06/03 0905) Pulse Rate: 134 (06/03 0905) Intake/Output from previous day: 06/02 0701 - 06/03 0700 In: 720 [P.O.:720] Out: 650 [Urine:650] Intake/Output from this shift:    Labs:  Recent Labs  04/02/16 0440  WBC 6.5  HGB 13.4  HCT 39.1*  PLT 180     Recent Labs  04/01/16 0352 04/02/16 0440 04/03/16 0546  NA 139 139 137  K 4.4 4.4 4.3  CL 106 102 102  CO2 27 28 28   GLUCOSE 93 92 116*  BUN 9 11 14   CREATININE 0.69 0.75 0.78  CALCIUM 8.8* 9.2 9.6  MG 1.6* 1.8 1.6*   Estimated Creatinine Clearance: 70.5 mL/min (by C-G formula based on Cr of 0.78).    Recent Labs  04/02/16 1605 04/02/16 2100 04/03/16 0736  GLUCAP 146* 102* 115*    Medical History: Past Medical History  Diagnosis Date  . Diabetes mellitus ([place])   . Chronic back pain   . Hypertension   . Stroke [facility])     a. x 2 in 1995  . Hyperlipidemia   . CAD (coronary artery disease)     a. s/p 4 vessel CABG in 2007, no interventions since  . Atrial flutter ([place])     a. originally diagnosed 09/2015; b. not on full-dose anticoagulation coming into the hospital; c. CHADS2VASc at least 6 (HTN, age, DM, stroke x 2, vascular disease)  . Throat cancer ([place])     a. 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 s/p tracheostmy and XRT in 2007  . Carotid artery disease ([place])     a. s/p left-sided stenting in 1995    Medications:  Scheduled:  . digoxin  0.125 mg Oral Daily  . diltiazem  120 mg Oral Daily  . docusate sodium  100 mg Oral BID  . insulin aspart  0-15 Units Subcutaneous TID WC  . insulin aspart  0-5 Units Subcutaneous QHS  . levothyroxine  150 mcg Oral QAC breakfast  . magnesium  oxide  400 mg Oral BID  . magnesium sulfate 1 - 4 g bolus IVPB  2 g Intravenous Once  . metoprolol tartrate  100 mg Oral BID  . omega-3 acid ethyl esters  1 g Oral QPM  . polyethylene glycol  17 g Oral Daily  . rivaroxaban  20 mg Oral Q supper  . rosuvastatin  40 mg Oral QPM   Infusions:     Assessment: Pharmacy consulted to assist in managing electrolytes in this 75 y/o M with atrial flutter. Patient on Digoxin- watch hypokalemia/hypomagnesia  Plan:  Magnesium replaced with 2 g mag sulfate iv x 1. Will f/u AM labs.   6/2- K+=4.3, Mag= 1.6. Will increase MagOx from 400mg  daily po to 400mg  po Bid. Will give 1 dose of Magnesium 2 gram IV. F/u am labs.  Anieya Helman A 04/03/2016,9:26 AM

## 2016-04-04 LAB — CULTURE, BLOOD (ROUTINE X 2)
CULTURE: NO GROWTH
Culture: NO GROWTH

## 2016-04-04 LAB — BASIC METABOLIC PANEL
Anion gap: 8 (ref 5–15)
BUN: 16 mg/dL (ref 6–20)
CALCIUM: 9.5 mg/dL (ref 8.9–10.3)
CHLORIDE: 99 mmol/L — AB (ref 101–111)
CO2: 30 mmol/L (ref 22–32)
CREATININE: 0.74 mg/dL (ref 0.61–1.24)
GFR calc non Af Amer: >60 mL/min (ref >60)
Glucose, Bld: 124 mg/dL — ABNORMAL HIGH (ref 65–99)
Potassium: 4.1 mmol/L (ref 3.5–5.1)
SODIUM: 137 mmol/L (ref 135–145)

## 2016-04-04 LAB — GLUCOSE, CAPILLARY
GLUCOSE-CAPILLARY: 111 mg/dL — AB (ref 65–99)
GLUCOSE-CAPILLARY: 202 mg/dL — AB (ref 65–99)
Glucose-Capillary: 103 mg/dL — ABNORMAL HIGH (ref 65–99)
Glucose-Capillary: 135 mg/dL — ABNORMAL HIGH (ref 65–99)

## 2016-04-04 LAB — MAGNESIUM: MAGNESIUM: 1.8 mg/dL (ref 1.7–2.4)

## 2016-04-04 MED ORDER — METOPROLOL TARTRATE 100 MG PO TABS
100.0000 mg | ORAL_TABLET | Freq: Three times a day (TID) | ORAL | Status: DC
  Administered 2016-04-04 – 2016-04-06 (×6): 100 mg via ORAL
  Filled 2016-04-04 (×6): qty 1

## 2016-04-04 MED ORDER — METOPROLOL TARTRATE 100 MG PO TABS
100.0000 mg | ORAL_TABLET | Freq: Three times a day (TID) | ORAL | Status: DC
  Administered 2016-04-04: 100 mg via ORAL
  Filled 2016-04-04: qty 1

## 2016-04-04 MED ORDER — DILTIAZEM HCL 25 MG/5ML IV SOLN
5.0000 mg | INTRAVENOUS | Status: DC | PRN
  Filled 2016-04-04: qty 5

## 2016-04-04 MED ORDER — METOPROLOL TARTRATE 25 MG PO TABS: 125.0000 mg | ORAL_TABLET | Freq: Three times a day (TID) | ORAL | Status: DC

## 2016-04-04 MED ORDER — MAGNESIUM SULFATE 2 GM/50ML IV SOLN
2.0000 g | Freq: Once | INTRAVENOUS | Status: DC
  Filled 2016-04-04: qty 50

## 2016-04-04 NOTE — Progress Notes (Signed)
[facility] [facility] [place] NAME: Mitchell Huffman    MR#:  [id number]  DATE OF BIRTH:  07/01/1941  SUBJECTIVE:    Patient's heart rate did well yesterday, now this morning again elevated in the 130s. He is currently asymptomatic    REVIEW OF SYSTEMS:    Review of Systems  Constitutional: Denies malaise or fatigue S Negative for fever and chills.  HENT: Negative for ear discharge, ear pain, hearing loss, nosebleeds and sore throat.   Eyes: Negative for blurred vision and pain.  Respiratory: Negative for cough, hemoptysis and wheezing.   Cardiovascular: Negative for chest pain, palpitations and leg swelling.  Gastrointestinal: Negative for nausea, vomiting, abdominal pain, diarrhea and blood in stool.  Genitourinary: Negative for dysuria.  Musculoskeletal: Negative for back pain.  Neurological: Negative for dizziness, tremors, speech change, focal weakness, seizures and headaches.  Endo/Heme/Allergies: Does not bruise/bleed easily.  Psychiatric/Behavioral: Negative for depression, suicidal ideas and hallucinations.    Tolerating Diet:yes      DRUG ALLERGIES:  No Known Allergies  VITALS:  Blood pressure 128/86, pulse 133, temperature 97.6 F (36.4 C), temperature source Oral, resp. rate 16, height 5\' 5"  (1.651 m), weight 72.3 kg (159 lb 6.3 oz), SpO2 99 %.  PHYSICAL EXAMINATION:   Physical Exam  Constitutional: He is oriented to person, place, and time and well-developed, well-nourished, and in no distress. No distress.  HENT:  Head: Normocephalic.  Eyes: No scleral icterus.  Neck: Normal range of motion. Neck supple. No JVD present. No tracheal deviation present.  Cardiovascular: Normal rate.  Exam reveals no gallop and no friction rub.   Murmur heard. Irregular heart rhythm  Pulmonary/Chest: Effort normal and breath sounds normal. No respiratory distress. He has no wheezes. He has no rales. He exhibits no tenderness.  Abdominal:  Soft. Bowel sounds are normal. He exhibits no distension and no mass. There is no tenderness. There is no rebound and no guarding.  Musculoskeletal: Normal range of motion. He exhibits no edema.  Neurological: He is alert and oriented to person, place, and time.  Skin: Skin is warm. No rash noted. No erythema.  Psychiatric: Affect and judgment normal.      LABORATORY PANEL:   CBC  Recent Labs Lab 04/02/16 0440  WBC 6.5  HGB 13.4  HCT 39.1*  PLT 180   ------------------------------------------------------------------------------------------------------------------  Chemistries   Recent Labs Lab 04/04/16 0605  NA 137  K 4.1  CL 99*  CO2 30  GLUCOSE 124*  BUN 16  CREATININE 0.74  CALCIUM 9.5  MG 1.8   ------------------------------------------------------------------------------------------------------------------  Cardiac Enzymes No results for input(s): TROPONINI in the last 168 hours. ------------------------------------------------------------------------------------------------------------------  RADIOLOGY:  No results found.   ASSESSMENT AND PLAN:   75 year old male with a history of CAD/CABG, CVA, stage IV throat cancer, a flutter and diabetes who presented with weakness  1. Atrial flutter with RVR: Appreciate cardiology consult. Due to severely reduced LV function of 25-30% As well as severe aortic stenosis Heart rate still poorly controlled last night patient required multiple doses of IV Cardizem Increase metoprolol to 125 mg 3 times a day Cardizem was added yesterday Likely refractory due to severe aortic stenosis and systolic dysfunction  2. Hyponatremia: This has improved  3. Severe aortic stenosis needs outpatient follow-up full evaluation for intervention  4. Hypothyroidism: Continue Synthroid TSH was within normal limits  5. CAD: Continue metoprolol and Crestor.  6. History CVA: Continue aspirin and statin    CODE STATUS:  FULL  TOTAL TIME TAKING CARE OF THIS PATIENT: 25 minutes.   Heart rate still under poor control will await cardiology recommendations   Dustin Flock M.D on 04/04/2016 at 8:51 AM  Between 7am to 6pm - Pager - [phone] After 6pm go to [email or web address] - password [email or web address] [facility]  Office  [phone]  CC: Primary care physician; Volanda Napoleon, MD  Note: This dictation was prepared with Dragon dictation along with smaller phrase technology. Any transcriptional errors that result from this process are unintentional.

## 2016-04-04 NOTE — Progress Notes (Signed)
Patient's HR was sustaining in the 130. Metoprolol 5 mg IV PRN was administered and it did not touch it. Dr. Marcille Blanco notified with a new order for Cardizem 5 mg x 1 dose. Patient's HR dropped down below 100 when the RN was going to administer the Cardizem. HR is controlled now so the patient did not receive the Cardizem IV push. No acute distress noted. Patient was asymptomatic for the episode.

## 2016-04-04 NOTE — Progress Notes (Signed)
PHARMACY CONSULT NOTE - follow up  Pharmacy Consult for Electrolyte Monitoring  No Known Allergies  Patient Measurements: Height: 5\' 5"  (165.1 cm) Weight: 159 lb 6.3 oz (72.3 kg) IBW/kg (Calculated) : 61.5  Vital Signs: BP: 128/86 mmHg (06/04 0844) Pulse Rate: 133 (06/04 0844) Intake/Output from previous day: 06/03 0701 - 06/04 0700 In: 720 [P.O.:720] Out: 1700 [Urine:1700] Intake/Output from this shift: Total I/O In: -  Out: 280 [Urine:280]  Labs:  Recent Labs  04/02/16 0440  WBC 6.5  HGB 13.4  HCT 39.1*  PLT 180     Recent Labs  04/02/16 0440 04/03/16 0546 04/04/16 0605  NA 139 137 137  K 4.4 4.3 4.1  CL 102 102 99*  CO2 28 28 30   GLUCOSE 92 116* 124*  BUN 11 14 16   CREATININE 0.75 0.78 0.74  CALCIUM 9.2 9.6 9.5  MG 1.8 1.6* 1.8   Estimated Creatinine Clearance: 70.5 mL/min (by C-G formula based on Cr of 0.74).    Recent Labs  04/03/16 1724 04/03/16 2117 04/04/16 0725  GLUCAP 97 122* 111*    Medical History: Past Medical History  Diagnosis Date  . Diabetes mellitus ([place])   . Chronic back pain   . Hypertension   . Stroke [facility])     a. x 2 in 1995  . Hyperlipidemia   . CAD (coronary artery disease)     a. s/p 4 vessel CABG in 2007, no interventions since  . Atrial flutter ([place])     a. originally diagnosed 09/2015; b. not on full-dose anticoagulation coming into the hospital; c. CHADS2VASc at least 6 (HTN, age, DM, stroke x 2, vascular disease)  . Throat cancer ([place])     a. 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 s/p tracheostmy and XRT in 2007  . Carotid artery disease ([place])     a. s/p left-sided stenting in 1995    Medications:  Scheduled:  . digoxin  0.125 mg Oral Daily  . docusate sodium  100 mg Oral BID  . insulin aspart  0-15 Units Subcutaneous TID WC  . insulin aspart  0-5 Units Subcutaneous QHS  . levothyroxine  150 mcg Oral QAC breakfast  . magnesium oxide  400 mg Oral BID  . magnesium sulfate 1 - 4 g bolus IVPB   2 g Intravenous Once  . metoprolol tartrate  100 mg Oral TID  . omega-3 acid ethyl esters  1 g Oral QPM  . polyethylene glycol  17 g Oral Daily  . rivaroxaban  20 mg Oral Q supper  . rosuvastatin  40 mg Oral QPM   Infusions:     Assessment: Pharmacy consulted to assist in managing electrolytes in this 75 y/o M with atrial flutter. Patient on Digoxin- watch hypokalemia/hypomagnesia  Plan:  Magnesium replaced with 2 g mag sulfate iv x 1. Will f/u AM labs.   6/3- K+=4.3, Mag= 1.6. Will increase MagOx from 400mg  daily po to 400mg  po Bid. Will give 1 dose of Magnesium 2 gram IV. F/u am labs.  6/4- K+=4.1, Mag=1.8. Will continue MagOx 400mg  bid. Will give IV Mag. Sulfate 2 gram x 1 (goal > 2.0, pt on digoxin).  Tishara Pizano A 04/04/2016,8:46 AM

## 2016-04-04 NOTE — Progress Notes (Addendum)
Patient: Mitchell Huffman / Admit Date: 03/30/2016 / Date of Encounter: 04/04/2016, 6:38 AM   Subjective: Patient continues to do well and denies chest pain, SOB. He has no complaints. Heart rate is up into the 130s, still in atrial flutter with rapid ventricular response. When asked, patient does not actually feel his heart racing. He seems to be tolerating it well with blood pressure maintaining in the normal range.  Review of Systems: Review of Systems  Constitutional: Positive for weight loss and malaise/fatigue. Negative for fever, chills and diaphoresis.  HENT: Negative for congestion.   Eyes: Negative for discharge and redness.  Respiratory: Negative for cough, hemoptysis, sputum production, shortness of breath and wheezing.   Cardiovascular: Negative for chest pain, palpitations, orthopnea, claudication, leg swelling and PND.  Gastrointestinal: Negative for nausea, vomiting and abdominal pain.  Musculoskeletal: Negative for myalgias and falls.  Skin: Negative for rash.  Neurological: Positive for weakness. Negative for dizziness, sensory change, speech change, focal weakness and loss of consciousness.  Endo/Heme/Allergies: Does not bruise/bleed easily.  Psychiatric/Behavioral: The patient is not nervous/anxious.   All other systems reviewed and are negative.   Objective: Telemetry: Atrial flutter with variable AV conduction with heart rates ranging from the 60's to 130's bpm Physical Exam: Blood pressure 132/70, pulse 68, temperature 97.6 F (36.4 C), temperature source Oral, resp. rate 16, height 5\' 5"  (1.651 m), weight 159 lb 6.3 oz (72.3 kg), SpO2 99 %. Body mass index is 26.52 kg/(m^2). General: Well developed, well nourished, in no acute distress. Head: Normocephalic, atraumatic, sclera non-icteric, no xanthomas, nares are without discharge. Neck: Negative for carotid bruits. JVP not elevated. Trach noted.  Lungs: Clear bilaterally to auscultation without wheezes, rales,  or rhonchi. Breathing is unlabored. Heart: regular,normal S2 without murmurs, rubs, or gallops.  Abdomen: Soft, non-tender, non-distended with normoactive bowel sounds. No rebound/guarding. Extremities: No clubbing or cyanosis. No edema. Distal pedal pulses are 2+ and equal bilaterally. Neuro: Alert and oriented X 3. Moves all extremities spontaneously. Psych:  Responds to questions appropriately with a normal affect.   Intake/Output Summary (Last 24 hours) at 04/04/16 0638 Last data filed at 04/04/16 0335  Gross per 24 hour  Intake    720 ml  Output   1700 ml  Net   -980 ml    Inpatient Medications:  . digoxin  0.125 mg Oral Daily  . diltiazem  120 mg Oral Daily  . docusate sodium  100 mg Oral BID  . insulin aspart  0-15 Units Subcutaneous TID WC  . insulin aspart  0-5 Units Subcutaneous QHS  . levothyroxine  150 mcg Oral QAC breakfast  . magnesium oxide  400 mg Oral BID  . metoprolol tartrate  100 mg Oral BID  . omega-3 acid ethyl esters  1 g Oral QPM  . polyethylene glycol  17 g Oral Daily  . rivaroxaban  20 mg Oral Q supper  . rosuvastatin  40 mg Oral QPM   Infusions:     Labs:  Recent Labs  04/02/16 0440 04/03/16 0546  NA 139 137  K 4.4 4.3  CL 102 102  CO2 28 28  GLUCOSE 92 116*  BUN 11 14  CREATININE 0.75 0.78  CALCIUM 9.2 9.6  MG 1.8 1.6*   No results for input(s): AST, ALT, ALKPHOS, BILITOT, PROT, ALBUMIN in the last 72 hours.  Recent Labs  04/02/16 0440  WBC 6.5  HGB 13.4  HCT 39.1*  MCV 96.1  PLT 180   No  results for input(s): CKTOTAL, CKMB, TROPONINI in the last 72 hours. Invalid input(s): POCBNP No results for input(s): HGBA1C in the last 72 hours.   Weights: Filed Weights   03/30/16 1757 03/30/16 2325  Weight: 185 lb (83.915 kg) 159 lb 6.3 oz (72.3 kg)     Radiology/Studies:  Dg Chest 2 View  03/30/2016  CLINICAL DATA:  COUGH, SHORTNESS BREATH, AND CHEST CONGESTION FOR 1 MONTH. CORONARY ARTERY DISEASE. EXAM: CHEST  2 VIEW  COMPARISON:  05/28/2015 FINDINGS: Low lung volumes are again demonstrated. Heart size is stable and probably within normal limits allowing for low lung volumes and AP view. No evidence of pulmonary infiltrate or edema. No evidence of pneumothorax or pleural effusion. Prior CABG noted. IMPRESSION: Low lung volumes.  No active cardiopulmonary disease. Electronically Signed   By: Earle Gell M.D.   On: 03/30/2016 18:46     Assessment and Plan  Principal Problem:   Atrial flutter with rapid ventricular response (HCC) Active Problems:   Hypotension   CAD (coronary artery disease)   Diabetes mellitus (HCC)   H/O: CVA (cerebrovascular accident)   Dyslipidemia   HTN (hypertension)   Throat cancer ([place])    1. Typical atrial flutter with RVR: Tolerating well despite rapid heart rate. Blood pressure is maintained in the normal range. Patient has no symptoms of palpitations. No shortness of breath or chest pain. Continue with anticoagulation with Xarelto. Previously, he had not been on anticoagulation. Sotalol was discontinued due to depressed ejection fraction. Rate control will be difficult with atrial flutter. Fortunately, he seems to be tolerating even rapid heart rates. Ideally, he would benefit from TEE/cardioversion however, history of radiation treatment to the throat is an absolute contraindication for TEE. If he becomes hemodynamically unstable will need to pursue emergent DCCV or amiodarone loading In the meantime, continue with up titrating metoprolol to metoprolol 100mg  tid and Lopressor 5mg  IV prn.  It has been elected to keep him on digoxin, recommend to continue monitoring digoxin levels. Unfortunately, diltiazem is not the best option in terms of rate control in the setting of depressed ejection fraction. Will discontinue.  Eventually, he may require outpatient DC cardioversion after adequate anticoagulation of 4 weeks. Will also benefit from EP evaluation.   2. Chronic systolic  CHF: -EF [id number], diffuse hypokinesis Does not appear to be in acute decompensation Unable to obtain prior echocardiogram. There may be element of tachycardia induced cardiomyopathy -Will need repeat echo one month s/p restoration of sinus rhythm to assess for improvement in LVEF. If LVEF remains depressed, may need ischemic evaluation -For now, continue Lopressor Consider low-dose lisinopril 2.5 mg daily given cardiomyopathy if BP tolerates   3. CAD s/p CABG as above: -No symptoms concerning for ischemia at this time --Consider aspirin 81 mg by mouth daily -Lopressor as above  3. Hypotension: -Improved -Discontinuation of losartan improved BP    4. Stage 4 throat cancer: -S/p tracheostomy and XRT -Would be difficult to perform TEE given his history of XRT, thus will move forward with rate control and DCCV in 3 weeks as above -Per primary team  5. History of stroke x 2 in 1995: -S/p stenting to LICA Patient now on Xarelto   6. HLD: -Continue Crestor -LDL 31  7. DM: -Per primary team -A1C 6.0%  8. Malnutrition: -Could supplement with Boost/Ensure -Consider nutrition evaluation   9. Hypothyroidism:  -In the setting of cancer as above -On repletion as above. Management per primary team  10. Hypomagnesemia: Management per primary team  11. Aortic stenosis:  severe AS by estimated valve area of 0.63cm^2 (difficult to determine in the setting of moderate to severely depressed EF. Visually, AS appeared to be moderate, possibly moderate to severe. -Will need repeat imaging once rhythm is restored to sinus -As above, TEE would be at significant risk given his history of XRT to the throat -Medical management may be best given his comorbidities   I spent at least 35 minutes with the patient today and more than 50% of the time was spent counseling the patient and coordinating care.     Signed, Wende Bushy, MD, [place] [facility]

## 2016-04-04 NOTE — Progress Notes (Signed)
Patient's HR is going from low 100's to sustaining in the 130s for several minutes. Patient is sitting in chair, asymptomatic. Notified Dr. Posey Pronto. No new orders at this time. Will continue to monitor. Horton Finer

## 2016-04-04 NOTE — Progress Notes (Signed)
Notified Dr. Yvone Neu concerning patient's HR going from low 100s to sustaining in the 130s. No new orders. Will administer morning meds and continue to monitor.Patient is asymptomatic.  Mitchell Huffman

## 2016-04-04 NOTE — Progress Notes (Signed)
Patient ambulated to the bathroom with cane. HR up in the 130s with activity. Patient is now in chair sitting. With no complaints. Patient HR back down into the 80s. Will continue to monitor Mitchell Huffman

## 2016-04-05 LAB — BASIC METABOLIC PANEL
Anion gap: 8 (ref 5–15)
BUN: 20 mg/dL (ref 6–20)
CO2: 29 mmol/L (ref 22–32)
CREATININE: 0.81 mg/dL (ref 0.61–1.24)
Calcium: 9.5 mg/dL (ref 8.9–10.3)
Chloride: 101 mmol/L (ref 101–111)
GFR calc Af Amer: >60 mL/min (ref >60)
GLUCOSE: 110 mg/dL — AB (ref 65–99)
Potassium: 4 mmol/L (ref 3.5–5.1)
SODIUM: 138 mmol/L (ref 135–145)

## 2016-04-05 LAB — GLUCOSE, CAPILLARY
Glucose-Capillary: 92 mg/dL (ref 65–99)
Glucose-Capillary: 143 mg/dL — ABNORMAL HIGH (ref 65–99)
Glucose-Capillary: 132 mg/dL — ABNORMAL HIGH (ref 65–99)
Glucose-Capillary: 121 mg/dL — ABNORMAL HIGH (ref 65–99)

## 2016-04-05 LAB — MAGNESIUM: MAGNESIUM: 1.7 mg/dL (ref 1.7–2.4)

## 2016-04-05 MED ORDER — SODIUM CHLORIDE 0.9% FLUSH
3.0000 mL | Freq: Two times a day (BID) | INTRAVENOUS | Status: DC
  Administered 2016-04-05: 3 mL via INTRAVENOUS

## 2016-04-05 MED ORDER — AMIODARONE HCL 200 MG PO TABS
400.0000 mg | ORAL_TABLET | Freq: Two times a day (BID) | ORAL | Status: DC
  Administered 2016-04-05 – 2016-04-06 (×3): 400 mg via ORAL
  Filled 2016-04-05 (×3): qty 2

## 2016-04-05 MED ORDER — MAGNESIUM OXIDE 400 (241.3 MG) MG PO TABS
800.0000 mg | ORAL_TABLET | Freq: Two times a day (BID) | ORAL | Status: DC
  Administered 2016-04-05 – 2016-04-06 (×2): 800 mg via ORAL
  Filled 2016-04-05 (×2): qty 2

## 2016-04-05 MED ORDER — MAGNESIUM SULFATE 4 GM/100ML IV SOLN
4.0000 g | Freq: Once | INTRAVENOUS | Status: AC
  Administered 2016-04-05: 4 g via INTRAVENOUS
  Filled 2016-04-05: qty 100

## 2016-04-05 NOTE — Progress Notes (Signed)
[place] [facility] [place] NAME: Mitchell Huffman    MR#:  [id number]  DATE OF BIRTH:  1941-07-12  SUBJECTIVE:     Patient's heart rate goes into the 140s with activity.  REVIEW OF SYSTEMS:    Review of Systems  Constitutional: Denies malaise or fatigue S Negative for fever and chills.  HENT: Negative for ear discharge, ear pain, hearing loss, nosebleeds and sore throat.   Eyes: Negative for blurred vision and pain.  Respiratory: Negative for cough, hemoptysis and wheezing.   Cardiovascular: Negative for chest pain, palpitations and leg swelling.  Gastrointestinal: Negative for nausea, vomiting, abdominal pain, diarrhea and blood in stool.  Genitourinary: Negative for dysuria.  Musculoskeletal: Negative for back pain.  Neurological: Negative for dizziness, tremors, speech change, focal weakness, seizures and headaches.  Endo/Heme/Allergies: Does not bruise/bleed easily.  Psychiatric/Behavioral: Negative for depression, suicidal ideas and hallucinations.    Tolerating Diet:yes      DRUG ALLERGIES:  No Known Allergies  VITALS:  Blood pressure 98/50, pulse 56, temperature 97.6 F (36.4 C), temperature source Oral, resp. rate 18, height 5\' 5"  (1.651 m), weight 72.3 kg (159 lb 6.3 oz), SpO2 95 %.  PHYSICAL EXAMINATION:   Physical Exam  Constitutional: He is oriented to person, place, and time and well-developed, well-nourished, and in no distress. No distress.  HENT:  Head: Normocephalic.  Eyes: No scleral icterus.  Neck: Normal range of motion. Neck supple. No JVD present. No tracheal deviation present.  Cardiovascular: Normal rate.  Exam reveals no gallop and no friction rub.   Murmur heard. Irregular heart rhythm  Pulmonary/Chest: Effort normal and breath sounds normal. No respiratory distress. He has no wheezes. He has no rales. He exhibits no tenderness.  Abdominal: Soft. Bowel sounds are normal. He exhibits no distension and no mass.  There is no tenderness. There is no rebound and no guarding.  Musculoskeletal: Normal range of motion. He exhibits no edema.  Neurological: He is alert and oriented to person, place, and time.  Skin: Skin is warm. No rash noted. No erythema.  Psychiatric: Affect and judgment normal.      LABORATORY PANEL:   CBC  Recent Labs Lab 04/02/16 0440  WBC 6.5  HGB 13.4  HCT 39.1*  PLT 180   ------------------------------------------------------------------------------------------------------------------  Chemistries   Recent Labs Lab 04/05/16 0638  NA 138  K 4.0  CL 101  CO2 29  GLUCOSE 110*  BUN 20  CREATININE 0.81  CALCIUM 9.5  MG 1.7   ------------------------------------------------------------------------------------------------------------------  Cardiac Enzymes No results for input(s): TROPONINI in the last 168 hours. ------------------------------------------------------------------------------------------------------------------  RADIOLOGY:  No results found.   ASSESSMENT AND PLAN:   75 year old male with a history of CAD/CABG, CVA, stage IV throat cancer, a flutter and diabetes who presented with weakness  1. Atrial flutter with RVR: Appreciate cardiology consult. Due to severely reduced LV function of 25-30% As well as severe aortic stenosis Discuss with cardiology will do amiodarone orally along with metoprolol Continue Xarelto  2. Hyponatremia: This has improved  3. Severe aortic stenosis needs outpatient follow-up   4. Hypothyroidism: Continue Synthroid TSH was within normal limits  5. CAD: Continue metoprolol and Crestor.  6. History CVA: Continue aspirin and statin    CODE STATUS: FULL  TOTAL TIME TAKING CARE OF THIS PATIENT: 22 minutes.   Heart rate still under poor control will await cardiology recommendations   Dustin Flock M.D on 04/05/2016 at 1:22 PM  Between 7am to 6pm -  Pager - [phone] After 6pm go to [email or web address] -  password [email or web address] [facility]  Office  [phone]  CC: Primary care physician; Volanda Napoleon, MD  Note: This dictation was prepared with Dragon dictation along with smaller phrase technology. Any transcriptional errors that result from this process are unintentional.

## 2016-04-05 NOTE — Evaluation (Signed)
Physical Therapy Evaluation Patient Details Name: Mitchell Huffman MRN: [id number] DOB: Feb 15, 1941 Today's Date: 04/05/2016   History of Present Illness  Pt is a 75 y/o male that presents with weakness, found to be in A-fib with RVR. He has a history of throat cancer and has a trach. Moderate to severe aortic stenosis, decreased EF.   Clinical Impression  Patient evaluated for gait and tolerance to activity. He has a baseline resting HR in this session of 80 bpm, with exertion it quickly increases to 110s with a maximum of 124 bpm during very short bouts of ambulation. He initially attempts ambulation with QC, however he begins reaching for support with contralateral hand immediately. With RW he requires frequent cues to maintain his LEs inside the RW, increase step length, and maintain more erect posture. He presents as a falls risk and is quite deconditioned, he would benefit from HHPT evaluation for home set up, balance and activity tolerance training.     Follow Up Recommendations Home health PT    Equipment Recommendations  Rolling walker with 5" wheels    Recommendations for Other Services Speech consult (Speaking valve consult. )     Precautions / Restrictions Precautions Precautions: Fall Restrictions Weight Bearing Restrictions: No      Mobility  Bed Mobility               General bed mobility comments: Pt received in bedside recliner.   Transfers Overall transfer level: Needs assistance Equipment used: Rolling walker (2 wheeled) Transfers: Sit to/from Stand Sit to Stand: Supervision         General transfer comment: Patient demonstrates appropriate hand placement, mild loss of balance to his L initially, which he corrects.   Ambulation/Gait Ambulation/Gait assistance: Supervision Ambulation Distance (Feet): 120 Feet   Gait Pattern/deviations: Step-through pattern;Shuffle;Decreased step length - left;Decreased step length - right;Narrow base of support;Trunk  flexed   Gait velocity interpretation: Below normal speed for age/gender General Gait Details: Patient demonstrates flexed trunk, decreased step length with QC and reaching out for assistance with RUE. Provided patient with RW instead, continued with shuffling, flexed trunk gait. Multiple reminders to increase step length, keep COM inside RW, stand up taller provided with moderate improvement (not full retention).   Stairs            Wheelchair Mobility    Modified Rankin (Stroke Patients Only)       Balance Overall balance assessment: Needs assistance Sitting-balance support: No upper extremity supported;Feet supported Sitting balance-Leahy Scale: Normal     Standing balance support: Bilateral upper extremity supported Standing balance-Leahy Scale: Fair Standing balance comment: Decreased gait speed with flexed trunk indicative of falls risk.                              Pertinent Vitals/Pain Pain Assessment: No/denies pain    Home Living Family/patient expects to be discharged to:: Private residence Living Arrangements: Spouse/significant other Available Help at Discharge: Family Type of Home: House Home Access: Stairs to enter Entrance Stairs-Rails:  (Did not specify) Entrance Stairs-Number of Steps: 4 Home Layout: One level Home Equipment: [place] - 2 wheels;Cane - quad      Prior Function Level of Independence: Independent with assistive device(s)         Comments: Patient presents having ambulated in room with QC, reports he has a RW.      Hand Dominance        Extremity/Trunk Assessment  Upper Extremity Assessment: Overall WFL for tasks assessed           Lower Extremity Assessment: Overall WFL for tasks assessed         Communication   Communication: Tracheostomy  Cognition Arousal/Alertness: Awake/alert Behavior During Therapy: Flat affect Overall Cognitive Status: Within Functional Limits for tasks assessed                       General Comments      Exercises        Assessment/Plan    PT Assessment Patient needs continued PT services  PT Diagnosis Difficulty walking;Generalized weakness   PT Problem List Decreased strength;Decreased balance;Cardiopulmonary status limiting activity;Decreased mobility;Decreased activity tolerance  PT Treatment Interventions DME instruction;Gait training;Stair training;Therapeutic activities;Therapeutic exercise;Balance training   PT Goals (Current goals can be found in the Care Plan section) Acute Rehab PT Goals Patient Stated Goal: To return home  PT Goal Formulation: With patient Time For Goal Achievement: 04/19/16 Potential to Achieve Goals: Good    Frequency Min 2X/week   Barriers to discharge        Co-evaluation               End of Session Equipment Utilized During Treatment: Gait belt Activity Tolerance: Patient tolerated treatment well;Patient limited by fatigue Patient left: in chair;with chair alarm set;with call bell/phone within reach Nurse Communication: Mobility status (HR)         Time: [id number] PT Time Calculation (min) (ACUTE ONLY): 19 min   Charges:   PT Evaluation $PT Eval High Complexity: 1 Procedure     PT G Codes:       Kerman Passey, PT, DPT    04/05/2016, 6:19 PM

## 2016-04-05 NOTE — Progress Notes (Signed)
Shift assessment complete.  No new abnormal findings noted.  Denies pain.  Up to BR independently with cane.

## 2016-04-05 NOTE — Progress Notes (Signed)
Patient: Mitchell Huffman / Admit Date: 03/30/2016 / Date of Encounter: 04/05/2016, 9:08 AM   Subjective: Patient continues to do well and denies chest pain, SOB. He has no complaints. He continues to be in atrial flutter with significant tachycardia with any physical activities. Heart rate right now is 130 per minute.  Review of Systems: Review of Systems  Constitutional: Positive for weight loss and malaise/fatigue. Negative for fever, chills and diaphoresis.  HENT: Negative for congestion.   Eyes: Negative for discharge and redness.  Respiratory: Negative for cough, hemoptysis, sputum production, shortness of breath and wheezing.   Cardiovascular: Negative for chest pain, palpitations, orthopnea, claudication, leg swelling and PND.  Gastrointestinal: Negative for nausea, vomiting and abdominal pain.  Musculoskeletal: Negative for myalgias and falls.  Skin: Negative for rash.  Neurological: Positive for weakness. Negative for dizziness, sensory change, speech change, focal weakness and loss of consciousness.  Endo/Heme/Allergies: Does not bruise/bleed easily.  Psychiatric/Behavioral: The patient is not nervous/anxious.   All other systems reviewed and are negative.   Objective: Telemetry: Atrial flutter with variable AV conduction with heart rates ranging from the 60's to 130's bpm Physical Exam: Blood pressure 106/58, pulse 70, temperature 97.8 F (36.6 C), temperature source Oral, resp. rate 14, height 5\' 5"  (1.651 m), weight 159 lb 6.3 oz (72.3 kg), SpO2 95 %. Body mass index is 26.52 kg/(m^2). General: Well developed, well nourished, in no acute distress. Head: Normocephalic, atraumatic, sclera non-icteric, no xanthomas, nares are without discharge. Neck: Negative for carotid bruits. JVP not elevated. Trach noted.  Lungs: Clear bilaterally to auscultation without wheezes, rales, or rhonchi. Breathing is unlabored. Heart: regular,normal S2 without murmurs, rubs, or gallops.    Abdomen: Soft, non-tender, non-distended with normoactive bowel sounds. No rebound/guarding. Extremities: No clubbing or cyanosis. No edema. Distal pedal pulses are 2+ and equal bilaterally. Neuro: Alert and oriented X 3. Moves all extremities spontaneously. Psych:  Responds to questions appropriately with a normal affect.   Intake/Output Summary (Last 24 hours) at 04/05/16 0908 Last data filed at 04/04/16 1630  Gross per 24 hour  Intake    480 ml  Output      0 ml  Net    480 ml    Inpatient Medications:  . amiodarone  400 mg Oral BID WC  . digoxin  0.125 mg Oral Daily  . docusate sodium  100 mg Oral BID  . insulin aspart  0-15 Units Subcutaneous TID WC  . insulin aspart  0-5 Units Subcutaneous QHS  . levothyroxine  150 mcg Oral QAC breakfast  . magnesium oxide  400 mg Oral BID  . metoprolol tartrate  100 mg Oral TID  . omega-3 acid ethyl esters  1 g Oral QPM  . polyethylene glycol  17 g Oral Daily  . rivaroxaban  20 mg Oral Q supper  . rosuvastatin  40 mg Oral QPM   Infusions:     Labs:  Recent Labs  04/04/16 0605 04/05/16 0638  NA 137 138  K 4.1 4.0  CL 99* 101  CO2 30 29  GLUCOSE 124* 110*  BUN 16 20  CREATININE 0.74 0.81  CALCIUM 9.5 9.5  MG 1.8 1.7   No results for input(s): AST, ALT, ALKPHOS, BILITOT, PROT, ALBUMIN in the last 72 hours. No results for input(s): WBC, NEUTROABS, HGB, HCT, MCV, PLT in the last 72 hours. No results for input(s): CKTOTAL, CKMB, TROPONINI in the last 72 hours. Invalid input(s): POCBNP No results for input(s): HGBA1C in  the last 72 hours.   Weights: Filed Weights   03/30/16 1757 03/30/16 2325  Weight: 185 lb (83.915 kg) 159 lb 6.3 oz (72.3 kg)     Radiology/Studies:  Dg Chest 2 View  03/30/2016  CLINICAL DATA:  COUGH, SHORTNESS BREATH, AND CHEST CONGESTION FOR 1 MONTH. CORONARY ARTERY DISEASE. EXAM: CHEST  2 VIEW COMPARISON:  05/28/2015 FINDINGS: Low lung volumes are again demonstrated. Heart size is stable and probably  within normal limits allowing for low lung volumes and AP view. No evidence of pulmonary infiltrate or edema. No evidence of pneumothorax or pleural effusion. Prior CABG noted. IMPRESSION: Low lung volumes.  No active cardiopulmonary disease. Electronically Signed   By: Earle Gell M.D.   On: 03/30/2016 18:46     Assessment and Plan  Principal Problem:   Atrial flutter with rapid ventricular response (HCC) Active Problems:   Hypotension   CAD (coronary artery disease)   Diabetes mellitus (HCC)   H/O: CVA (cerebrovascular accident)   Dyslipidemia   HTN (hypertension)   Throat cancer ([employer])    1. Typical atrial flutter with RVR: Continue with anticoagulation with Xarelto. Previously, he had not been on anticoagulation. Sotalol was discontinued due to depressed ejection fraction. Rate control will be difficult with atrial flutter. Fortunately, he seems to be tolerating even rapid heart rates. Ideally, he would benefit from TEE/cardioversion however, history of radiation treatment to the throat is an absolute contraindication for TEE. I elected to add amiodarone today for 100 mg twice daily. He is already on metoprolol and small dose digoxin. In order to avoid significant bradycardia, digoxin should be stopped one week before planned cardioversion. Once heart rate is reasonably controlled, the patient can be discharged home with plans for outpatient cardioversion and EP consultation to see if he is a candidate for ablation.  2. Chronic systolic CHF: -EF [id number], diffuse hypokinesis Does not appear to be in acute decompensation Unable to obtain prior echocardiogram. There may be element of tachycardia induced cardiomyopathy -Will need repeat echo one month s/p restoration of sinus rhythm to assess for improvement in LVEF. If LVEF remains depressed, may need ischemic evaluation -For now, continue Lopressor He is currently not on an ACE inhibitor due to relatively low blood pressure and the need  to control heart rate with metoprolol.   3. CAD s/p CABG as above: -No symptoms concerning for ischemia at this time -- No recent ischemic events. Thus, no need for aspirin in the setting of anticoagulation. -Lopressor as above  4. Stage 4 throat cancer: -S/p tracheostomy and XRT - 5. History of stroke x 2 in 1995: -S/p stenting to LICA Patient now on Xarelto   6. Aortic stenosis:  severe AS by estimated valve area of 0.63cm^2 (difficult to determine in the setting of moderate to severely depressed EF. Visually, AS appeared to be moderate, possibly moderate to severe. -Will need repeat imaging once rhythm is restored to sinus -Medical management may be best given his comorbidities      Signed, Kathlyn Sacramento MD, [place] [facility]

## 2016-04-05 NOTE — Progress Notes (Signed)
Tracheal stoma, clean, dry.  Open to environment, no cover in place.

## 2016-04-05 NOTE — Care Management (Signed)
Barrier to discharge- control of tachycardia- Added amiodarone to regime today. Per attending,  heart is going up into the 130s with exertion.  Obtained order for physical therapy.

## 2016-04-05 NOTE — Progress Notes (Signed)
AP prior to digoxin:88

## 2016-04-05 NOTE — Care Management Important Message (Signed)
Important Message  Patient Details  Name: Mitchell Huffman MRN: [id number] Date of Birth: [birth year]   Medicare Important Message Given:  Yes    Katrina Stack, RN 04/05/2016, 2:29 PM

## 2016-04-05 NOTE — Progress Notes (Signed)
[place] for Electrolyte Monitoring  No Known Allergies  Patient Measurements: Height: 5\' 5"  (165.1 cm) Weight: 159 lb 6.3 oz (72.3 kg) IBW/kg (Calculated) : 61.5  Vital Signs: Temp: 97.6 F (36.4 C) (06/05 1119) Temp Source: Oral (06/05 0755) BP: 119/63 mmHg (06/05 1612) Pulse Rate: 80 (06/05 1812) Intake/Output from previous day: 06/04 0701 - 06/05 0700 In: 840 [P.O.:840] Out: 280 [Urine:280] Intake/Output from this shift:    Labs: No results for input(s): WBC, HGB, HCT, PLT, APTT, INR in the last 72 hours.   Recent Labs  04/03/16 0546 04/04/16 0605 04/05/16 0638  NA 137 137 138  K 4.3 4.1 4.0  CL 102 99* 101  CO2 28 30 29   GLUCOSE 116* 124* 110*  BUN 14 16 20   CREATININE 0.78 0.74 0.81  CALCIUM 9.6 9.5 9.5  MG 1.6* 1.8 1.7   Estimated Creatinine Clearance: 69.6 mL/min (by C-G formula based on Cr of 0.81).    Recent Labs  04/05/16 0736 04/05/16 1144 04/05/16 1657  GLUCAP 92 143* 132*    Medical History: Past Medical History  Diagnosis Date  . Diabetes mellitus ([place])   . Chronic back pain   . Hypertension   . Stroke [facility])     a. x 2 in 1995  . Hyperlipidemia   . CAD (coronary artery disease)     a. s/p 4 vessel CABG in 2007, no interventions since  . Atrial flutter ([place])     a. originally diagnosed 09/2015; b. not on full-dose anticoagulation coming into the hospital; c. CHADS2VASc at least 6 (HTN, age, DM, stroke x 2, vascular disease)  . Throat cancer ([place])     a. 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 s/p tracheostmy and XRT in 2007  . Carotid artery disease ([place])     a. s/p left-sided stenting in 1995    Medications:  Scheduled:  . amiodarone  400 mg Oral BID WC  . digoxin  0.125 mg Oral Daily  . docusate sodium  100 mg Oral BID  . insulin aspart  0-15 Units Subcutaneous TID WC  . insulin aspart  0-5 Units Subcutaneous QHS  . levothyroxine  150 mcg Oral QAC breakfast  . magnesium oxide   800 mg Oral BID  . metoprolol tartrate  100 mg Oral TID  . omega-3 acid ethyl esters  1 g Oral QPM  . polyethylene glycol  17 g Oral Daily  . rivaroxaban  20 mg Oral Q supper  . rosuvastatin  40 mg Oral QPM   Infusions:     Assessment: Pharmacy consulted to assist in managing electrolytes in this 75 y/o M with atrial flutter. Patient on Digoxin- monitoring for signs and symptoms of  Hypokalemia/hypomagnesia.  Plan:  Will order additional magnesium 4g IV x 1, goal >/=2, and increase magnesium to 800mg  PO BID.   Unless otherwise clinically indicated, will obtain follow up electrolytes on 6/7.    Pharmacy will continue to monitor and adjust per consult.  Casey Fye L 04/05/2016,7:33 PM

## 2016-04-05 NOTE — Progress Notes (Signed)
[place] for Rivaroxaban Indication: Atrial flutter  No Known Allergies  Patient Measurements: Height: 5\' 5"  (165.1 cm) Weight: 159 lb 6.3 oz (72.3 kg) IBW/kg (Calculated) : 61.5  Vital Signs: Temp: 97.6 F (36.4 C) (06/05 1119) Temp Source: Oral (06/05 0755) BP: 119/63 mmHg (06/05 1612) Pulse Rate: 80 (06/05 1812)  Labs:  Recent Labs  04/03/16 0546 04/04/16 0605 04/05/16 [id number]  CREATININE 0.78 0.74 0.81    Estimated Creatinine Clearance: 69.6 mL/min (by C-G formula based on Cr of 0.81).   Medical History: Past Medical History  Diagnosis Date  . Diabetes mellitus ([place])   . Chronic back pain   . Hypertension   . Stroke [facility])     a. x 2 in 1995  . Hyperlipidemia   . CAD (coronary artery disease)     a. s/p 4 vessel CABG in 2007, no interventions since  . Atrial flutter ([place])     a. originally diagnosed 09/2015; b. not on full-dose anticoagulation coming into the hospital; c. CHADS2VASc at least 6 (HTN, age, DM, stroke x 2, vascular disease)  . Throat cancer ([place])     a. 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 s/p tracheostmy and XRT in 2007  . Carotid artery disease ([place])     a. s/p left-sided stenting in 1995    Medications:  Scheduled:  . amiodarone  400 mg Oral BID WC  . digoxin  0.125 mg Oral Daily  . docusate sodium  100 mg Oral BID  . insulin aspart  0-15 Units Subcutaneous TID WC  . insulin aspart  0-5 Units Subcutaneous QHS  . levothyroxine  150 mcg Oral QAC breakfast  . magnesium oxide  800 mg Oral BID  . magnesium sulfate 1 - 4 g bolus IVPB  4 g Intravenous Once  . metoprolol tartrate  100 mg Oral TID  . omega-3 acid ethyl esters  1 g Oral QPM  . polyethylene glycol  17 g Oral Daily  . rivaroxaban  20 mg Oral Q supper  . rosuvastatin  40 mg Oral QPM   Infusions:     Assessment: Pharmacy consulted for rivaroxaban dosing for 75 yo male being treated for aflutter.   Plan:  Will continue  rivaroxaban 20 mg  daily with supper.   Simpson,Michael L 04/05/2016,7:37 PM

## 2016-04-05 NOTE — Progress Notes (Signed)
Patient had a great night. HR is controlled throughout this shift. No acute respiratory distress noted. Patient denied any pain. Will continue to monitor.

## 2016-04-06 ENCOUNTER — Telehealth: Payer: Self-pay | Admitting: Cardiovascular Disease

## 2016-04-06 LAB — GLUCOSE, CAPILLARY: GLUCOSE-CAPILLARY: 94 mg/dL (ref 65–99)

## 2016-04-06 MED ORDER — DIGOXIN 125 MCG PO TABS: 0.1250 mg | ORAL_TABLET | Freq: Every day | ORAL | Status: AC

## 2016-04-06 MED ORDER — AMIODARONE HCL 400 MG PO TABS: 400.0000 mg | ORAL_TABLET | Freq: Two times a day (BID) | ORAL | Status: DC

## 2016-04-06 MED ORDER — RIVAROXABAN 20 MG PO TABS: 20.0000 mg | ORAL_TABLET | Freq: Every day | ORAL | Status: DC

## 2016-04-06 MED ORDER — METOPROLOL TARTRATE 100 MG PO TABS: 100.0000 mg | ORAL_TABLET | Freq: Three times a day (TID) | ORAL | Status: AC

## 2016-04-06 NOTE — Discharge Summary (Signed)
Mitchell Huffman, 75 y.o., DOB 04-25-1941, MRN [id number]. Admission date: 03/30/2016 Discharge Date 04/06/2016 Primary MD Volanda Napoleon, MD Admitting Physician Quintella Baton, MD  Admission Diagnosis  Dehydration [E86.0] High serum lactate [R79.89]  Discharge Diagnosis   Principal Problem:   Atrial flutter with rapid ventricular response (HCC)    Hypotension   CAD (coronary artery disease)   Diabetes mellitus (HCC)   H/O: CVA (cerebrovascular accident)   Dyslipidemia   HTN (hypertension)   Throat cancer (HCC)  Severe aortic stenosis  Systolic dysfunction        Hospital CourseThis a 75 year old gentleman was brought to medical family because he just looked so peaked, pale and weak. In the ER the patient's blood pressure was low in the high 80s. His lactic acid level was also elevated. He was bolused 2 L and his blood pressures improved, but he is now in A. fib with RVR. Patient subsequently was asymptomatic however his A. fib continued to stay elevated. Therefore he had to have adjustment of his medications. He underwent echo which showed severe aortic stenosis as well as significant systolic dysfunction. He also has a history of having throat cancer and radiation therefore he could not have TEE. He'll be followed up as outpatient to evaluate his aortic stenosis. Heart rate is much better. And is stable for discharge.            Consults  cardiology  Significant Tests:  See full reports for all details      Dg Chest 2 View  03/30/2016  CLINICAL DATA:  COUGH, SHORTNESS BREATH, AND CHEST CONGESTION FOR 1 MONTH. CORONARY ARTERY DISEASE. EXAM: CHEST  2 VIEW COMPARISON:  05/28/2015 FINDINGS: Low lung volumes are again demonstrated. Heart size is stable and probably within normal limits allowing for low lung volumes and AP view. No evidence of pulmonary infiltrate or edema. No evidence of pneumothorax or pleural effusion. Prior CABG noted. IMPRESSION: Low lung volumes.  No  active cardiopulmonary disease. Electronically Signed   By: Earle Gell M.D.   On: 03/30/2016 18:46       Today   Subjective:   Mitchell Huffman  Feels better hr improved  Objective:   Blood pressure 116/61, pulse 66, temperature 97.5 F (36.4 C), temperature source Oral, resp. rate 19, height 5\' 5"  (1.651 m), weight 72.3 kg (159 lb 6.3 oz), SpO2 98 %.  .  Intake/Output Summary (Last 24 hours) at 04/06/16 1247 Last data filed at 04/06/16 1100  Gross per 24 hour  Intake    580 ml  Output    950 ml  Net   -370 ml    Exam VITAL SIGNS: Blood pressure 116/61, pulse 66, temperature 97.5 F (36.4 C), temperature source Oral, resp. rate 19, height 5\' 5"  (1.651 m), weight 72.3 kg (159 lb 6.3 oz), SpO2 98 %.  GENERAL:  75 y.o.-year-old patient lying in the bed with no acute distress.  EYES: Pupils equal, round, reactive to light and accommodation. No scleral icterus. Extraocular muscles intact.  HEENT: Head atraumatic, normocephalic. Oropharynx and nasopharynx clear.  NECK:  Supple, no jugular venous distention. No thyroid enlargement, no tenderness.  LUNGS: Normal breath sounds bilaterally, no wheezing, rales,rhonchi or crepitation. No use of accessory muscles of respiration.  CARDIOVASCULAR: irreg irreg normal. No murmurs, rubs, or gallops.  ABDOMEN: Soft, nontender, nondistended. Bowel sounds present. No organomegaly or mass.  EXTREMITIES: No pedal edema, cyanosis, or clubbing.  NEUROLOGIC: Cranial nerves II through XII are intact. Muscle strength 5/5 in  all extremities. Sensation intact. Gait not checked.  PSYCHIATRIC: The patient is alert and oriented x 3.  SKIN: No obvious rash, lesion, or ulcer.   Data Review     CBC w Diff: Lab Results  Component Value Date   WBC 6.5 04/02/2016   WBC 4.6 06/26/2012   HGB 13.4 04/02/2016   HGB 11.9* 06/26/2012   HCT 39.1* 04/02/2016   HCT 34.7* 06/26/2012   PLT 180 04/02/2016   PLT 179 06/26/2012   LYMPHOPCT 38 03/30/2016   MONOPCT 7  03/30/2016   EOSPCT 1 03/30/2016   BASOPCT 0 03/30/2016   CMP: Lab Results  Component Value Date   NA 138 04/05/2016   NA 124* 06/26/2012   K 4.0 04/05/2016   K 3.4* 06/26/2012   CL 101 04/05/2016   CL 87* 06/26/2012   CO2 29 04/05/2016   CO2 28 06/26/2012   BUN 20 04/05/2016   BUN 14 06/26/2012   CREATININE 0.81 04/05/2016   CREATININE 0.87 06/26/2012   PROT 6.4* 05/28/2015   PROT 7.5 06/26/2012   ALBUMIN 3.6 05/28/2015   ALBUMIN 3.7 06/26/2012   BILITOT 0.7 05/28/2015   BILITOT 0.3 06/26/2012   ALKPHOS 51 05/28/2015   ALKPHOS 114 06/26/2012   AST 15 05/28/2015   AST 81* 06/26/2012   ALT 12* 05/28/2015   ALT 55 06/26/2012  .  Micro Results Recent Results (from the past 240 hour(s))  Urine culture     Status: Abnormal   Collection Time: 03/30/16  5:40 PM  Result Value Ref Range Status   Specimen Description URINE, RANDOM  Final   Special Requests NONE  Final   Culture MULTIPLE SPECIES PRESENT, SUGGEST RECOLLECTION (A)  Final   Report Status 04/01/2016 FINAL  Final  Culture, blood (routine x 2)     Status: None   Collection Time: 03/30/16  6:15 PM  Result Value Ref Range Status   Specimen Description BLOOD LEFT WRIST  Final   Special Requests BOTTLES DRAWN AEROBIC AND ANAEROBIC  8CC  Final   Culture NO GROWTH 5 DAYS  Final   Report Status 04/04/2016 FINAL  Final  Culture, blood (routine x 2)     Status: None   Collection Time: 03/30/16  6:20 PM  Result Value Ref Range Status   Specimen Description BLOOD LEFT FATTY CASTS  Final   Special Requests BOTTLES DRAWN AEROBIC AND ANAEROBIC  10CC  Final   Culture NO GROWTH 5 DAYS  Final   Report Status 04/04/2016 FINAL  Final  MRSA PCR Screening     Status: None   Collection Time: 03/30/16 11:30 PM  Result Value Ref Range Status   MRSA by PCR NEGATIVE NEGATIVE Final    Comment:        The GeneXpert MRSA Assay (FDA approved for NASAL specimens only), is one component of a comprehensive MRSA  colonization surveillance program. It is not intended to diagnose MRSA infection nor to guide or monitor treatment for MRSA infections.         Code Status Orders        Start     Ordered   03/30/16 2324  Full code   Continuous     03/30/16 2323    Code Status History    Date Active Date Inactive Code Status Order ID Comments User Context   This patient has a current code status but no historical code status.    Advance Directive Documentation        Most  Recent Value   Type of Advance Directive  Healthcare Power of Attorney, Living will   Pre-existing out of facility DNR order (yellow form or pink MOST form)     "MOST" Form in Place?            Follow-up Information    Follow up with Volanda Napoleon, MD. Go on 04/14/2016.   Specialty:  Internal Medicine   Why:  at 3:00pm.   Contact information:   [place] [address] [phone]       Follow up with Ida Rogue, MD. Go on 04/27/2016.   Specialty:  Cardiology   Why:  with PA Christell Faith at 2:00pm.    Contact information:   [place] [address] [phone]       Discharge Medications     Medication List    STOP taking these medications        carvedilol 12.5 MG tablet  Commonly known as:  COREG      TAKE these medications        amiodarone 400 MG tablet  Commonly known as:  PACERONE  Take 1 tablet (400 mg total) by mouth 2 (two) times daily with a meal.     aspirin EC 81 MG tablet  Take 81 mg by mouth every evening.     cholecalciferol 1000 units tablet  Commonly known as:  VITAMIN D  Take 2,000 Units by mouth every evening.     clopidogrel 75 MG tablet  Commonly known as:  PLAVIX  Take 75 mg by mouth every evening.     CRESTOR 40 MG tablet  Generic drug:  rosuvastatin  Take 40 mg by mouth every evening.     digoxin 0.125 MG tablet  Commonly known as:  LANOXIN  Take 1 tablet (0.125 mg total) by mouth daily.      levothyroxine 150 MCG tablet  Commonly known as:  SYNTHROID, LEVOTHROID  Take 150 mcg by mouth daily before breakfast.     losartan 50 MG tablet  Commonly known as:  COZAAR  Take 50 mg by mouth every evening.     metFORMIN 850 MG tablet  Commonly known as:  GLUCOPHAGE  Take 850 mg by mouth 2 (two) times daily with a meal.     metoprolol 100 MG tablet  Commonly known as:  LOPRESSOR  Take 1 tablet (100 mg total) by mouth 3 (three) times daily.     multivitamin with minerals tablet  Take 1 tablet by mouth every evening.     omega-3 acid ethyl esters 1 g capsule  Commonly known as:  LOVAZA  Take 1 g by mouth every evening.     rivaroxaban 20 MG Tabs tablet  Commonly known as:  XARELTO  Take 1 tablet (20 mg total) by mouth daily with supper.     spironolactone 25 MG tablet  Commonly known as:  ALDACTONE  Take 12.5 mg by mouth every evening.     vitamin C 500 MG tablet  Commonly known as:  ASCORBIC ACID  Take 500 mg by mouth every evening.           Total Time in preparing paper work, data evaluation and todays exam - 35 minutes  Dustin Flock M.D on 04/06/2016 at 12:47 PM  [facility]   Office  [phone]

## 2016-04-06 NOTE — Progress Notes (Signed)
Up to recliner all shift.  HR <100 while up to bathroom.  Aflutter continues.

## 2016-04-06 NOTE — Progress Notes (Signed)
Pt is alert and oriented x 4, trach stoma, non verbal, uses written communication, denies pain, heart rate controlled, pt is d/c to home with home health, csw spoke with patients wife, appt scheduled to f/u with cardiology and pcp. Rx given to patient along with coupon for xarelto, pt reports understanding of d/c instructions and has no further questions at this time. Pushed to visitor entrance via Engineer, manufacturing.

## 2016-04-06 NOTE — Care Management Note (Signed)
Case Management Note  Patient Details  Name: Mitchell Huffman MRN: [id number] Date of Birth: 1941/05/31  Subjective/Objective:  Spoke with wife and patient. Updated on home health referral for SN and PT to [facility]. [place] with [facility] updated on discharge. Patient has all needed equipment. Discharging home on Xarelto. Coupon given. Verified address and phone number is correct.                   Action/Plan: [facility] Sn and PT. Xarelto coupon given.   Expected Discharge Date:   04/06/2016               Expected Discharge Plan:  [place]  In-House Referral:     Discharge planning Services  CM Consult  Post Acute Care Choice:  Home Health Choice offered to:  Spouse  DME Arranged:    DME Agency:     HH Arranged:  RN, PT [place] Agency:  [facility]  Status of Service:  Completed, signed off  Medicare Important Message Given:  Yes Date Medicare IM Given:    Medicare IM give by:    Date Additional Medicare IM Given:    Additional Medicare Important Message give by:     If discussed at [place] of Stay Meetings, dates discussed:    Additional Comments:  Jolly Mango, RN 04/06/2016, 9:28 AM

## 2016-04-06 NOTE — Progress Notes (Signed)
   See Dr. Tyrell Antonio note from 04/05/16. No new additions deviating from this plan today. To be discharged today.

## 2016-04-06 NOTE — Discharge Instructions (Signed)

## 2016-04-06 NOTE — Telephone Encounter (Signed)
Tcm/ph Pt is being discharged today saw Dr Fletcher Anon in hospital  Pt is coming 04/27/16 to see Christell Faith  Pt is on wait list.

## 2016-04-07 NOTE — Progress Notes (Signed)
Patient's spouse called up to unit with questions regarding patients medications and side effects. This information was reviewed in depth with patient's spouse and spouse reported gratitude and better understanding of medications. She also had questions about home health which RN was unable to sufficiently answered and so call was transferred to care manager to better address those specific concerns.

## 2016-04-13 ENCOUNTER — Telehealth: Payer: Self-pay | Admitting: Cardiovascular Disease

## 2016-04-13 MED ORDER — CRESTOR 40 MG PO TABS: 40.0000 mg | ORAL_TABLET | Freq: Every evening | ORAL | Status: AC

## 2016-04-13 NOTE — Telephone Encounter (Signed)
Refill sent for crestor 90 day supply

## 2016-04-13 NOTE — Telephone Encounter (Signed)
*  STAT* If patient is at the pharmacy, call can be transferred to refill team.   1. Which medications need to be refilled? (please list name of each medication and dose if known)  CRESTOR 40 MG tablet   2. Which pharmacy/location (including street and city if local pharmacy) is medication to be sent to? [place]  3. Do they need a 30 day or 90 day supply? 90 day

## 2016-04-16 ENCOUNTER — Telehealth: Payer: Self-pay

## 2016-04-16 ENCOUNTER — Other Ambulatory Visit: Payer: Self-pay

## 2016-04-16 NOTE — Telephone Encounter (Signed)
Letter mailed. Will continue to call

## 2016-04-16 NOTE — Telephone Encounter (Signed)
Brand name Crestor needs a prior authorization before patient can have filled.  Do you feel the patient should be on Crestor or is the generic okay to take. If the patient needs to be on the brand name Crestor, please explain why for documentation to the pharmacy.

## 2016-04-16 NOTE — Telephone Encounter (Signed)
Crestor was prescribed by his previous cardiologist. I don't know if he tried any other statins in the past or not but this can be clarified when he comes for follow-up with Christell Faith.  Ryan, I just looked at his discharge summary and it seems that they discharged him home on aspirin and Plavix in addition to Xarelto.  Our recommendation was to discontinue aspirin and Plavix so I am not really sure what happened. Can you look into this and discontinue dual antiplatelet therapy before he comes to see you. The risk of bleeding on triple therapy is very high.

## 2016-04-16 NOTE — Telephone Encounter (Signed)
Attempted to contact pt at both numbers provided.  One number disconnected. Recording at second number states unable to complete call. Called daughter, emergency contact. No answer, no VM. Will call again.

## 2016-04-16 NOTE — Telephone Encounter (Signed)
Mitchell Huffman (Key: [id number] [id number])   A Prior Authorization was not required. Please see more information at the bottom of the page.  A Prior authorization was sent for Crestor on 04/16/2016.

## 2016-04-16 NOTE — Telephone Encounter (Signed)
It is unclear why IM continued him on triple therapy at discharge. Our recommendations during his admission were to continue Xarelto only. Please contact the patient today and advise him to stop aspirin and Plavix. He is to continue Xarelto only. The risk of bleeding is too great on triple therapy.

## 2016-04-16 NOTE — Telephone Encounter (Signed)
S/w daughter, Malachy Mood, (emergency contact) to inquire of a working phone number for pt. She states she does not have this information as she was asked to leave his house Monday and has had no further contact with him.

## 2016-04-16 NOTE — Telephone Encounter (Signed)
Attempted both of patient's phone numbers again. Neither appear to be working numbers.

## 2016-04-16 NOTE — Telephone Encounter (Signed)
Error

## 2016-04-22 ENCOUNTER — Other Ambulatory Visit: Payer: Self-pay | Admitting: Cardiovascular Disease

## 2016-04-23 ENCOUNTER — Other Ambulatory Visit: Payer: Self-pay

## 2016-04-23 ENCOUNTER — Ambulatory Visit
Admission: RE | Admit: 2016-04-23 | Discharge: 2016-04-23 | Disposition: A | Discharge status: Home / Self Care | Payer: Medicare HMO | Source: Ambulatory Visit | Attending: Cardiovascular Disease | Admitting: Cardiovascular Disease

## 2016-04-23 ENCOUNTER — Ambulatory Visit: Payer: Medicare HMO | Admitting: Anesthesiology

## 2016-04-23 ENCOUNTER — Encounter
Admission: RE | Disposition: A | Discharge status: Home / Self Care | Payer: Self-pay | Source: Ambulatory Visit | Attending: Cardiovascular Disease

## 2016-04-23 ENCOUNTER — Encounter: Payer: Self-pay | Admitting: *Deleted

## 2016-04-23 DIAGNOSIS — I509 Heart failure, unspecified: Secondary | ICD-10-CM | POA: Diagnosis not present

## 2016-04-23 DIAGNOSIS — I252 Old myocardial infarction: Secondary | ICD-10-CM | POA: Insufficient documentation

## 2016-04-23 DIAGNOSIS — Z87891 Personal history of nicotine dependence: Secondary | ICD-10-CM | POA: Insufficient documentation

## 2016-04-23 DIAGNOSIS — I4891 Unspecified atrial fibrillation: Secondary | ICD-10-CM | POA: Insufficient documentation

## 2016-04-23 DIAGNOSIS — I739 Peripheral vascular disease, unspecified: Secondary | ICD-10-CM | POA: Diagnosis not present

## 2016-04-23 DIAGNOSIS — Z8673 Personal history of transient ischemic attack (TIA), and cerebral infarction without residual deficits: Secondary | ICD-10-CM | POA: Diagnosis not present

## 2016-04-23 DIAGNOSIS — E119 Type 2 diabetes mellitus without complications: Secondary | ICD-10-CM | POA: Insufficient documentation

## 2016-04-23 DIAGNOSIS — Z951 Presence of aortocoronary bypass graft: Secondary | ICD-10-CM | POA: Diagnosis not present

## 2016-04-23 DIAGNOSIS — I110 Hypertensive heart disease with heart failure: Secondary | ICD-10-CM | POA: Insufficient documentation

## 2016-04-23 DIAGNOSIS — I2510 Atherosclerotic heart disease of native coronary artery without angina pectoris: Secondary | ICD-10-CM | POA: Diagnosis not present

## 2016-04-23 HISTORY — PX: ELECTROPHYSIOLOGIC STUDY: SHX172A

## 2016-04-23 SURGERY — CARDIOVERSION (CATH LAB)
Anesthesia: General

## 2016-04-23 MED ORDER — PROPOFOL 10 MG/ML IV BOLUS
INTRAVENOUS | Status: DC | PRN
  Administered 2016-04-23: 50 mg via INTRAVENOUS

## 2016-04-23 MED ORDER — SODIUM CHLORIDE 0.9 % IV SOLN
INTRAVENOUS | Status: DC | PRN
  Administered 2016-04-23: 08:00:00 via INTRAVENOUS

## 2016-04-23 NOTE — Anesthesia Procedure Notes (Signed)
Date/Time: 04/23/2016 7:34 AM Performed by: Doreen Salvage Pre-anesthesia Checklist: Patient identified, Emergency Drugs available, Suction available, Patient being monitored and Timeout performed Patient Re-evaluated:Patient Re-evaluated prior to inductionOxygen Delivery Method: Simple face mask Intubation Type: IV induction Comments: Patient has a stoma in place. O2 applied to stoma via t-collar at 100%. HOB elevated. Patient tolerated well

## 2016-04-23 NOTE — Transfer of Care (Signed)
Immediate Anesthesia Transfer of Care Note  Patient: Mitchell Huffman  Procedure(s) Performed: Procedure(s): CARDIOVERSION (N/A)  Patient Location: PACU and Short Stay  Anesthesia Type:General  Level of Consciousness: awake, alert  and oriented  Airway & Oxygen Therapy: Patient Spontanous Breathing and Patient connected to nasal cannula oxygen  Post-op Assessment: Report given to RN and Post -op Vital signs reviewed and stable  Post vital signs: Reviewed and stable  Last Vitals:  Filed Vitals:   04/23/16 0747 04/23/16 0751  BP:  93/47  Pulse: 36 50  Temp:    Resp: 17 14    Complications: No apparent anesthesia complications

## 2016-04-23 NOTE — Progress Notes (Signed)
  NAME:  Mitchell Huffman   MRN: [id number] DOB:  1941/05/26   ADMIT DATE: 04/23/2016  Procedure: Electrical Cardioversion Indications: Atrial fibrillation  Procedure Details:    Time Out: Verified patient identification, verified procedure, site/side was marked, verified correct patient position, special equipment/implants available, medications/allergies/relevent history reviewed, required imaging and test results available.    Patient placed on cardiac monitor, pulse oximetry, supplemental oxygen as necessary.  Sedation given:  Pacer pads placed   Cardioverted . To NSR Cardioverted at 200j  Evaluation: Findings: Post procedure EKG shows: NSR Complications: none Patient did well.     Dionisio David, M.D. [facility]   04/23/2016 8:28 AM

## 2016-04-23 NOTE — Anesthesia Postprocedure Evaluation (Signed)
Anesthesia Post Note  Patient: Mitchell Huffman  Procedure(s) Performed: Procedure(s) (LRB): CARDIOVERSION (N/A)  Patient location during evaluation: Other Anesthesia Type: General Level of consciousness: awake and alert Pain management: pain level controlled Vital Signs Assessment: post-procedure vital signs reviewed and stable Respiratory status: spontaneous breathing and respiratory function stable Cardiovascular status: stable Anesthetic complications: no    Last Vitals:  Filed Vitals:   04/23/16 0747 04/23/16 0751  BP:  93/47  Pulse: 36 50  Temp:    Resp: 17 14    Last Pain: There were no vitals filed for this visit.               KEPHART,WILLIAM K

## 2016-04-23 NOTE — Anesthesia Preprocedure Evaluation (Signed)
Anesthesia Evaluation  Patient identified by MRN, date of birth, ID band Patient awake    Reviewed: Allergy & Precautions, NPO status , Patient's Chart, lab work & pertinent test results  History of Anesthesia Complications Negative for: history of anesthetic complications  Airway Mallampati: III       Dental   Pulmonary neg pulmonary ROS, former smoker,           Cardiovascular hypertension, Pt. on medications + CAD, + Past MI, + CABG, + Peripheral Vascular Disease and +CHF  + dysrhythmias Atrial Fibrillation      Neuro/Psych CVA, No Residual Symptoms negative neurological ROS     GI/Hepatic negative GI ROS, Neg liver ROS,   Endo/Other  diabetes, Type 2, Oral Hypoglycemic Agents  Renal/GU negative Renal ROS     Musculoskeletal   Abdominal   Peds  Hematology negative hematology ROS (+)   Anesthesia Other Findings   Reproductive/Obstetrics                             Anesthesia Physical Anesthesia Plan  ASA: III  Anesthesia Plan: General   Post-op Pain Management:    Induction: Intravenous  Airway Management Planned: Tracheostomy  Additional Equipment:   Intra-op Plan:   Post-operative Plan:   Informed Consent: I have reviewed the patients History and Physical, chart, labs and discussed the procedure including the risks, benefits and alternatives for the proposed anesthesia with the patient or authorized representative who has indicated his/her understanding and acceptance.     Plan Discussed with:   Anesthesia Plan Comments:         Anesthesia Quick Evaluation

## 2016-04-27 ENCOUNTER — Encounter: Payer: Medicare HMO | Admitting: Physician Assistant

## 2016-04-27 ENCOUNTER — Encounter: Payer: Self-pay | Admitting: *Deleted

## 2016-04-27 ENCOUNTER — Encounter: Payer: Self-pay | Admitting: Physician Assistant

## 2017-01-25 IMAGING — CR DG CHEST 1V PORT
1 series · 1 of 1 positions shown · non-contrast
Comparison: 05/27/2015

CLINICAL DATA: Increasing weakness, onset today.

EXAM:
PORTABLE CHEST - 1 VIEW

[ap]
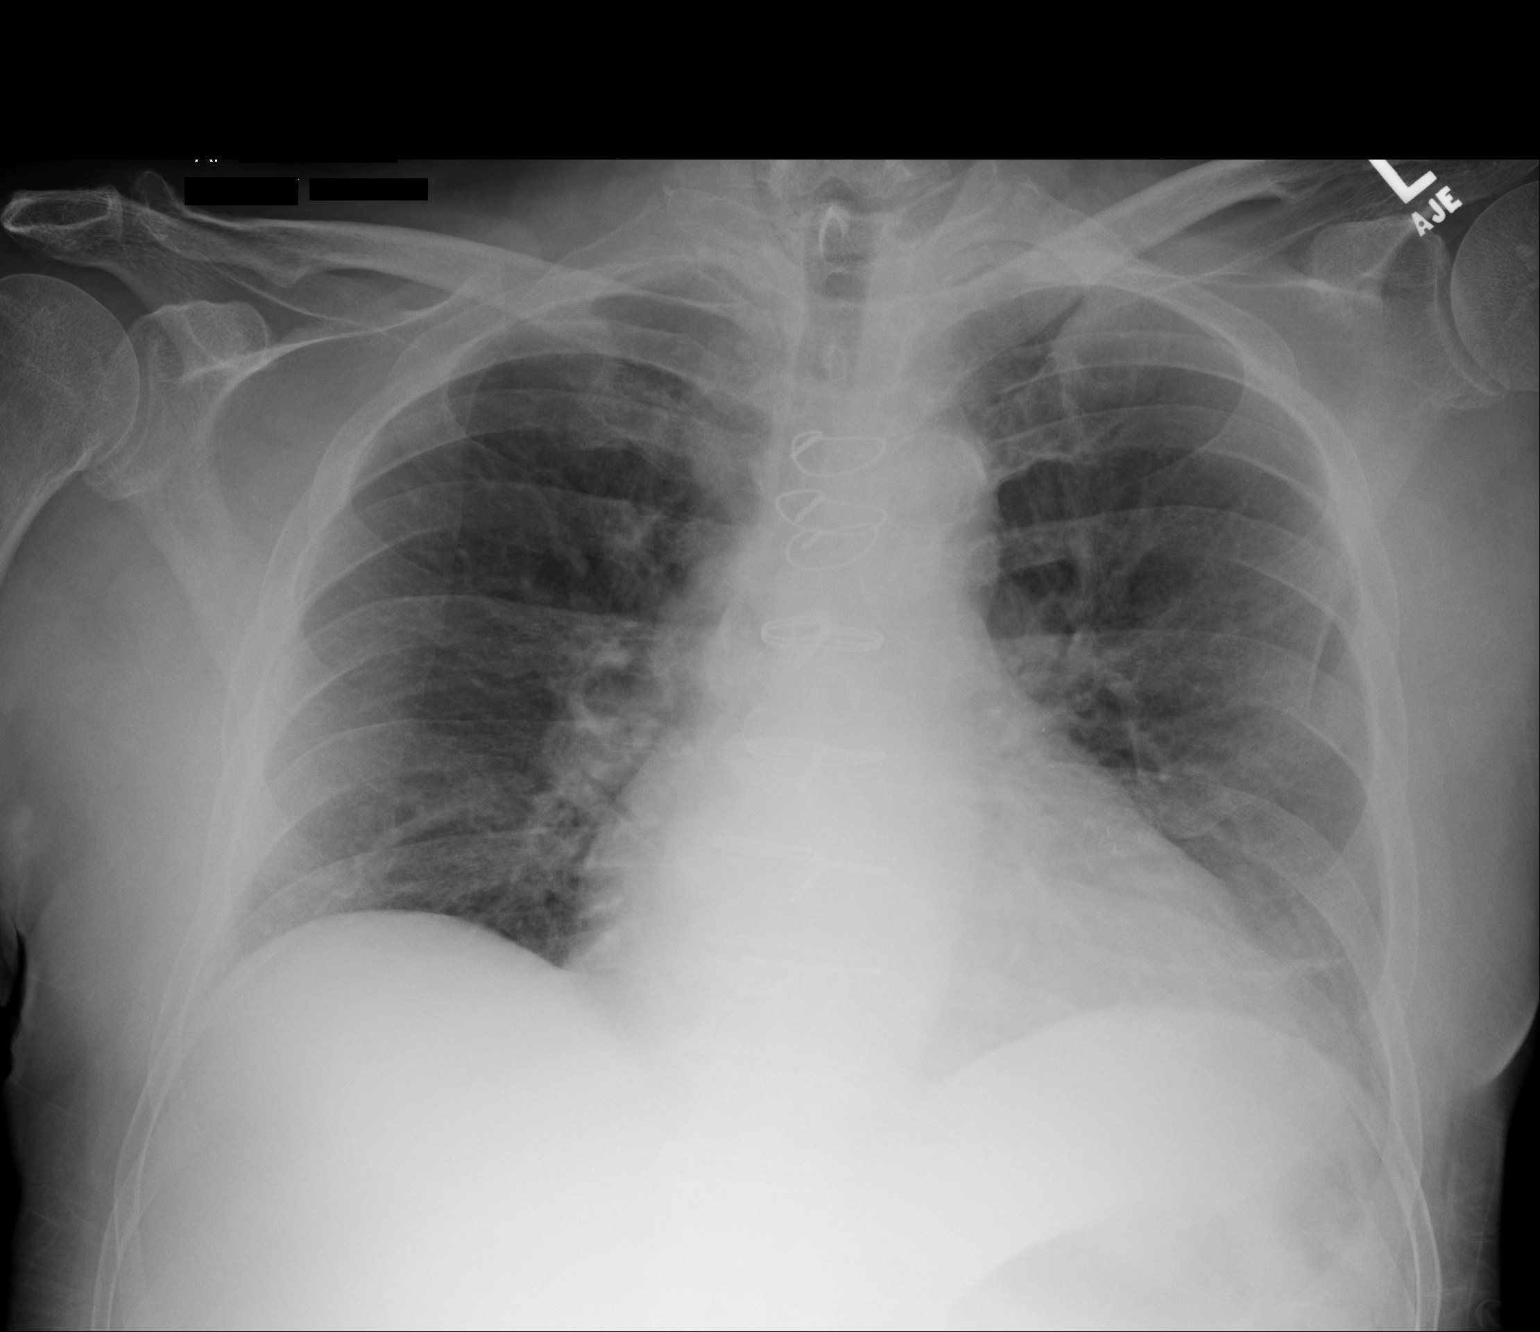

[1 of 1 positions shown; findings below may reference images not displayed]

FINDINGS: There is mild unchanged cardiomegaly. There is unchanged curvilinear
scarring in the left base. The lungs are otherwise clear. There is
no large effusion.
IMPRESSION: Unchanged cardiomegaly.  No acute findings.

## 2017-11-28 IMAGING — CR DG CHEST 2V
2 series · 2 of 2 positions shown · non-contrast
Comparison: 05/28/2015

CLINICAL DATA: COUGH, SHORTNESS BREATH, AND CHEST CONGESTION FOR 1
MONTH. CORONARY ARTERY DISEASE.

EXAM:
CHEST  2 VIEW

[chest lat]
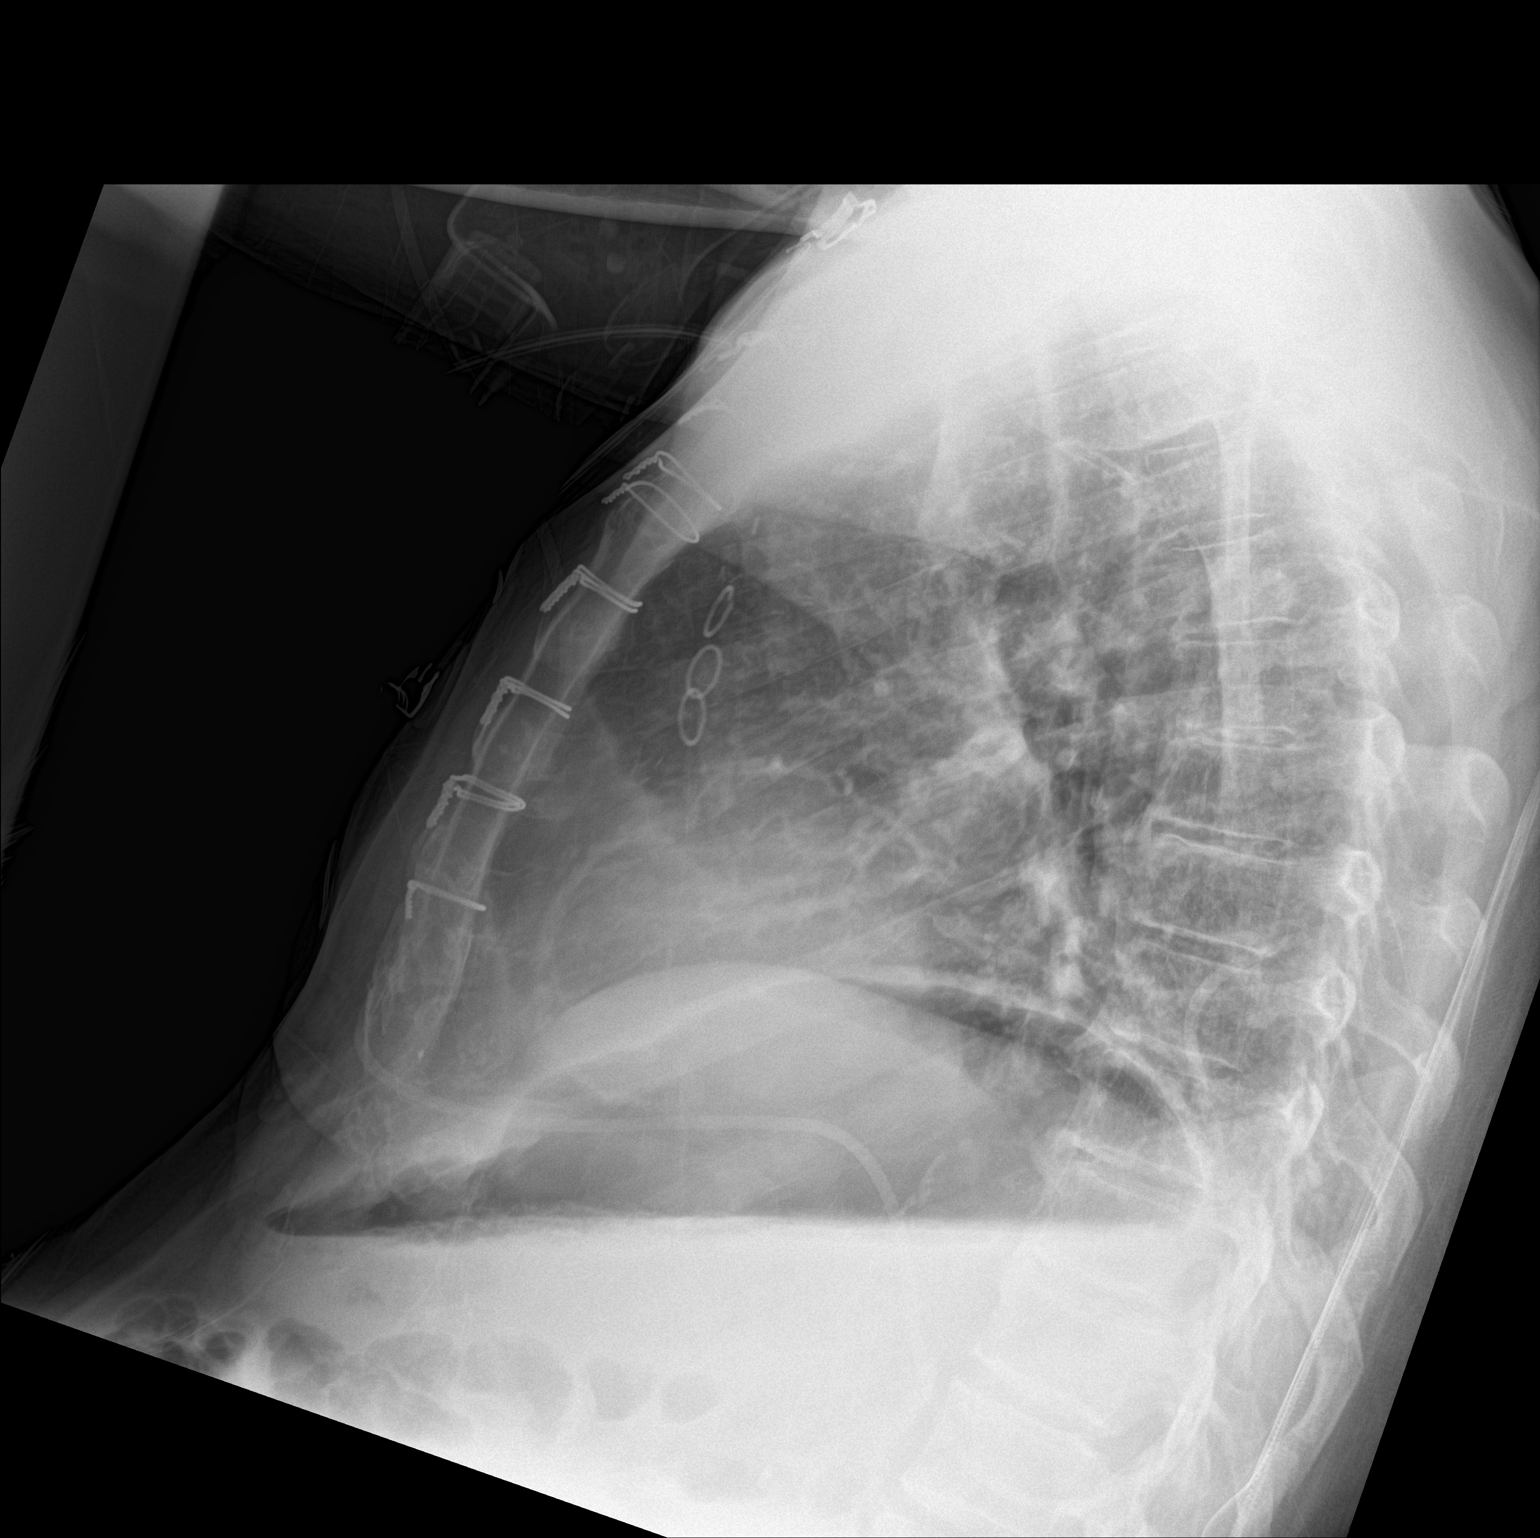

[chest ap]
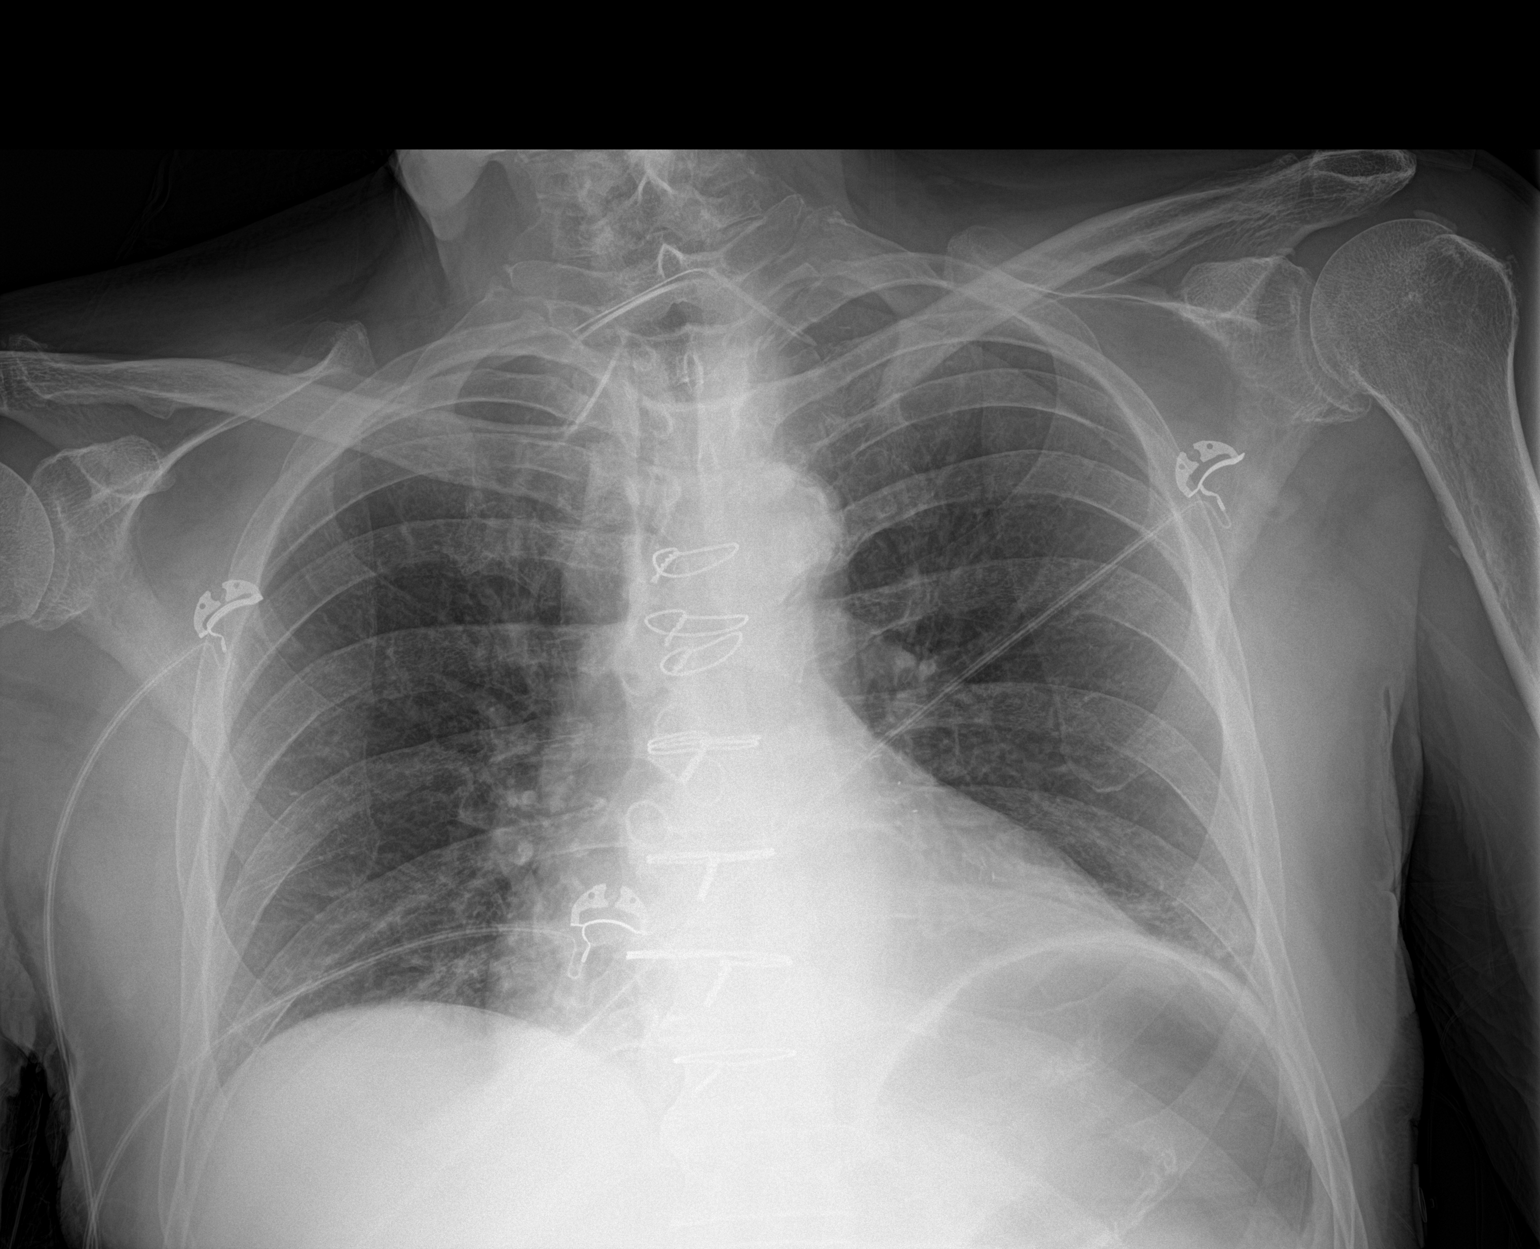

[2 of 2 positions shown; findings below may reference images not displayed]

FINDINGS: Low lung volumes are again demonstrated. Heart size is stable and
probably within normal limits allowing for low lung volumes and AP
view. No evidence of pulmonary infiltrate or edema. No evidence of
pneumothorax or pleural effusion. Prior CABG noted.
IMPRESSION: Low lung volumes.  No active cardiopulmonary disease.

## 2018-09-09 ENCOUNTER — Encounter: Payer: Self-pay | Admitting: Emergency Medicine

## 2018-09-09 ENCOUNTER — Emergency Department: Payer: Medicare HMO

## 2018-09-09 ENCOUNTER — Inpatient Hospital Stay
Admission: EM | Admit: 2018-09-09 | Discharge: 2018-10-01 | DRG: 871 | Disposition: E | Payer: Medicare HMO | Attending: Pulmonary Disease | Admitting: Pulmonary Disease

## 2018-09-09 DIAGNOSIS — I480 Paroxysmal atrial fibrillation: Secondary | ICD-10-CM | POA: Diagnosis present

## 2018-09-09 DIAGNOSIS — R059 Cough, unspecified: Secondary | ICD-10-CM

## 2018-09-09 DIAGNOSIS — Z8349 Family history of other endocrine, nutritional and metabolic diseases: Secondary | ICD-10-CM

## 2018-09-09 DIAGNOSIS — I110 Hypertensive heart disease with heart failure: Secondary | ICD-10-CM | POA: Diagnosis present

## 2018-09-09 DIAGNOSIS — I5043 Acute on chronic combined systolic (congestive) and diastolic (congestive) heart failure: Secondary | ICD-10-CM | POA: Diagnosis not present

## 2018-09-09 DIAGNOSIS — I420 Dilated cardiomyopathy: Secondary | ICD-10-CM | POA: Diagnosis not present

## 2018-09-09 DIAGNOSIS — M549 Dorsalgia, unspecified: Secondary | ICD-10-CM | POA: Diagnosis present

## 2018-09-09 DIAGNOSIS — Z7902 Long term (current) use of antithrombotics/antiplatelets: Secondary | ICD-10-CM

## 2018-09-09 DIAGNOSIS — Z7982 Long term (current) use of aspirin: Secondary | ICD-10-CM

## 2018-09-09 DIAGNOSIS — I214 Non-ST elevation (NSTEMI) myocardial infarction: Secondary | ICD-10-CM | POA: Diagnosis not present

## 2018-09-09 DIAGNOSIS — Z0189 Encounter for other specified special examinations: Secondary | ICD-10-CM

## 2018-09-09 DIAGNOSIS — Z85819 Personal history of malignant neoplasm of unspecified site of lip, oral cavity, and pharynx: Secondary | ICD-10-CM

## 2018-09-09 DIAGNOSIS — E1165 Type 2 diabetes mellitus with hyperglycemia: Secondary | ICD-10-CM | POA: Diagnosis present

## 2018-09-09 DIAGNOSIS — Z79899 Other long term (current) drug therapy: Secondary | ICD-10-CM

## 2018-09-09 DIAGNOSIS — Z515 Encounter for palliative care: Secondary | ICD-10-CM | POA: Diagnosis present

## 2018-09-09 DIAGNOSIS — R6520 Severe sepsis without septic shock: Secondary | ICD-10-CM | POA: Diagnosis present

## 2018-09-09 DIAGNOSIS — I340 Nonrheumatic mitral (valve) insufficiency: Secondary | ICD-10-CM | POA: Diagnosis not present

## 2018-09-09 DIAGNOSIS — J40 Bronchitis, not specified as acute or chronic: Secondary | ICD-10-CM | POA: Diagnosis present

## 2018-09-09 DIAGNOSIS — Z79890 Hormone replacement therapy (postmenopausal): Secondary | ICD-10-CM

## 2018-09-09 DIAGNOSIS — Z789 Other specified health status: Secondary | ICD-10-CM | POA: Diagnosis not present

## 2018-09-09 DIAGNOSIS — J189 Pneumonia, unspecified organism: Secondary | ICD-10-CM | POA: Diagnosis present

## 2018-09-09 DIAGNOSIS — G9340 Encephalopathy, unspecified: Secondary | ICD-10-CM | POA: Diagnosis present

## 2018-09-09 DIAGNOSIS — E039 Hypothyroidism, unspecified: Secondary | ICD-10-CM | POA: Diagnosis present

## 2018-09-09 DIAGNOSIS — R0603 Acute respiratory distress: Secondary | ICD-10-CM | POA: Diagnosis not present

## 2018-09-09 DIAGNOSIS — I255 Ischemic cardiomyopathy: Secondary | ICD-10-CM | POA: Diagnosis present

## 2018-09-09 DIAGNOSIS — I2510 Atherosclerotic heart disease of native coronary artery without angina pectoris: Secondary | ICD-10-CM | POA: Diagnosis present

## 2018-09-09 DIAGNOSIS — G8929 Other chronic pain: Secondary | ICD-10-CM | POA: Diagnosis present

## 2018-09-09 DIAGNOSIS — I483 Typical atrial flutter: Secondary | ICD-10-CM | POA: Diagnosis not present

## 2018-09-09 DIAGNOSIS — R34 Anuria and oliguria: Secondary | ICD-10-CM | POA: Diagnosis present

## 2018-09-09 DIAGNOSIS — E785 Hyperlipidemia, unspecified: Secondary | ICD-10-CM | POA: Diagnosis present

## 2018-09-09 DIAGNOSIS — I5022 Chronic systolic (congestive) heart failure: Secondary | ICD-10-CM | POA: Diagnosis present

## 2018-09-09 DIAGNOSIS — N179 Acute kidney failure, unspecified: Secondary | ICD-10-CM

## 2018-09-09 DIAGNOSIS — Z66 Do not resuscitate: Secondary | ICD-10-CM | POA: Diagnosis not present

## 2018-09-09 DIAGNOSIS — R0602 Shortness of breath: Secondary | ICD-10-CM | POA: Diagnosis present

## 2018-09-09 DIAGNOSIS — E872 Acidosis: Secondary | ICD-10-CM | POA: Diagnosis present

## 2018-09-09 DIAGNOSIS — Z7189 Other specified counseling: Secondary | ICD-10-CM | POA: Diagnosis not present

## 2018-09-09 DIAGNOSIS — R05 Cough: Secondary | ICD-10-CM

## 2018-09-09 DIAGNOSIS — A419 Sepsis, unspecified organism: Principal | ICD-10-CM

## 2018-09-09 DIAGNOSIS — Z951 Presence of aortocoronary bypass graft: Secondary | ICD-10-CM

## 2018-09-09 DIAGNOSIS — R000 Tachycardia, unspecified: Secondary | ICD-10-CM | POA: Diagnosis present

## 2018-09-09 DIAGNOSIS — I350 Nonrheumatic aortic (valve) stenosis: Secondary | ICD-10-CM | POA: Diagnosis present

## 2018-09-09 DIAGNOSIS — J9690 Respiratory failure, unspecified, unspecified whether with hypoxia or hypercapnia: Secondary | ICD-10-CM

## 2018-09-09 DIAGNOSIS — R570 Cardiogenic shock: Secondary | ICD-10-CM | POA: Diagnosis not present

## 2018-09-09 DIAGNOSIS — J9600 Acute respiratory failure, unspecified whether with hypoxia or hypercapnia: Secondary | ICD-10-CM

## 2018-09-09 DIAGNOSIS — Z7984 Long term (current) use of oral hypoglycemic drugs: Secondary | ICD-10-CM

## 2018-09-09 DIAGNOSIS — Z8673 Personal history of transient ischemic attack (TIA), and cerebral infarction without residual deficits: Secondary | ICD-10-CM

## 2018-09-09 DIAGNOSIS — Z8249 Family history of ischemic heart disease and other diseases of the circulatory system: Secondary | ICD-10-CM

## 2018-09-09 DIAGNOSIS — Z87891 Personal history of nicotine dependence: Secondary | ICD-10-CM

## 2018-09-09 DIAGNOSIS — R579 Shock, unspecified: Secondary | ICD-10-CM | POA: Diagnosis not present

## 2018-09-09 DIAGNOSIS — J9601 Acute respiratory failure with hypoxia: Secondary | ICD-10-CM | POA: Diagnosis present

## 2018-09-09 DIAGNOSIS — Z930 Tracheostomy status: Secondary | ICD-10-CM

## 2018-09-09 DIAGNOSIS — Z923 Personal history of irradiation: Secondary | ICD-10-CM

## 2018-09-09 LAB — CBC WITH DIFFERENTIAL/PLATELET
ABS IMMATURE GRANULOCYTES: 0.08 10*3/uL — AB (ref 0.00–0.07)
BASOS ABS: 0 10*3/uL (ref 0.0–0.1)
Basophils Relative: 0 %
EOS ABS: 0.1 10*3/uL (ref 0.0–0.5)
Eosinophils Relative: 1 %
HCT: 40.7 % (ref 39.0–52.0)
Hemoglobin: 13.9 g/dL (ref 13.0–17.0)
Immature Granulocytes: 1 %
Lymphocytes Relative: 3 %
Lymphs Abs: 0.3 10*3/uL — ABNORMAL LOW (ref 0.7–4.0)
MCH: 32.7 pg (ref 26.0–34.0)
MCHC: 34.2 g/dL (ref 30.0–36.0)
MCV: 95.8 fL (ref 80.0–100.0)
MONOS PCT: 6 %
Monocytes Absolute: 0.8 10*3/uL (ref 0.1–1.0)
NEUTROS PCT: 89 %
NRBC: 0 % (ref 0.0–0.2)
Neutro Abs: 11.3 10*3/uL — ABNORMAL HIGH (ref 1.7–7.7)
PLATELETS: 172 10*3/uL (ref 150–400)
RBC: 4.25 MIL/uL (ref 4.22–5.81)
RDW: 12.3 % (ref 11.5–15.5)
WBC: 12.6 10*3/uL — ABNORMAL HIGH (ref 4.0–10.5)

## 2018-09-09 LAB — COMPREHENSIVE METABOLIC PANEL
ALK PHOS: 70 U/L (ref 38–126)
ALT: 40 U/L (ref 0–44)
ANION GAP: 16 — AB (ref 5–15)
AST: 140 U/L — ABNORMAL HIGH (ref 15–41)
Albumin: 4 g/dL (ref 3.5–5.0)
BILIRUBIN TOTAL: 0.8 mg/dL (ref 0.3–1.2)
BUN: 22 mg/dL (ref 8–23)
CALCIUM: 9.2 mg/dL (ref 8.9–10.3)
CO2: 23 mmol/L (ref 22–32)
Chloride: 98 mmol/L (ref 98–111)
Creatinine, Ser: 2.14 mg/dL — ABNORMAL HIGH (ref 0.61–1.24)
GFR calc non Af Amer: 28 mL/min — ABNORMAL LOW (ref >60)
GFR, EST AFRICAN AMERICAN: 33 mL/min — AB (ref >60)
Glucose, Bld: 222 mg/dL — ABNORMAL HIGH (ref 70–99)
Potassium: 4.1 mmol/L (ref 3.5–5.1)
Sodium: 137 mmol/L (ref 135–145)
TOTAL PROTEIN: 8.2 g/dL — AB (ref 6.5–8.1)

## 2018-09-09 LAB — URINALYSIS, COMPLETE (UACMP) WITH MICROSCOPIC
Bacteria, UA: NONE SEEN
Bilirubin Urine: NEGATIVE
Glucose, UA: >=500 mg/dL — AB
Ketones, ur: NEGATIVE mg/dL
Leukocytes, UA: NEGATIVE
Nitrite: NEGATIVE
Protein, ur: 100 mg/dL — AB
Specific Gravity, Urine: 1.015 (ref 1.005–1.030)
pH: 5 (ref 5.0–8.0)

## 2018-09-09 LAB — GLUCOSE, CAPILLARY
Glucose-Capillary: 174 mg/dL — ABNORMAL HIGH (ref 70–99)
Glucose-Capillary: 163 mg/dL — ABNORMAL HIGH (ref 70–99)
Glucose-Capillary: 140 mg/dL — ABNORMAL HIGH (ref 70–99)

## 2018-09-09 LAB — MAGNESIUM: Magnesium: 1.7 mg/dL (ref 1.7–2.4)

## 2018-09-09 LAB — MRSA PCR SCREENING: MRSA by PCR: NEGATIVE

## 2018-09-09 LAB — BASIC METABOLIC PANEL
ANION GAP: 11 (ref 5–15)
BUN: 26 mg/dL — AB (ref 8–23)
CHLORIDE: 108 mmol/L (ref 98–111)
CO2: 17 mmol/L — ABNORMAL LOW (ref 22–32)
Calcium: 7.8 mg/dL — ABNORMAL LOW (ref 8.9–10.3)
Creatinine, Ser: 1.71 mg/dL — ABNORMAL HIGH (ref 0.61–1.24)
GFR calc Af Amer: 43 mL/min — ABNORMAL LOW (ref >60)
GFR, EST NON AFRICAN AMERICAN: 37 mL/min — AB (ref >60)
GLUCOSE: 138 mg/dL — AB (ref 70–99)
POTASSIUM: 4.2 mmol/L (ref 3.5–5.1)
Sodium: 136 mmol/L (ref 135–145)

## 2018-09-09 LAB — TROPONIN I
TROPONIN I: 23.15 ng/mL — AB (ref <0.03)
TROPONIN I: 23.42 ng/mL — AB (ref <0.03)
Troponin I: 8.03 ng/mL (ref <0.03)

## 2018-09-09 LAB — PROCALCITONIN: PROCALCITONIN: 26.49 ng/mL

## 2018-09-09 LAB — BRAIN NATRIURETIC PEPTIDE: B NATRIURETIC PEPTIDE 5: 1716 pg/mL — AB (ref 0.0–100.0)

## 2018-09-09 LAB — APTT: aPTT: 33 seconds (ref 24–36)

## 2018-09-09 LAB — LACTIC ACID, PLASMA
LACTIC ACID, VENOUS: 6.2 mmol/L — AB (ref 0.5–1.9)
LACTIC ACID, VENOUS: 2.9 mmol/L — AB (ref 0.5–1.9)

## 2018-09-09 LAB — PROTIME-INR
INR: 1.25
Prothrombin Time: 15.6 seconds — ABNORMAL HIGH (ref 11.4–15.2)

## 2018-09-09 LAB — TSH: TSH: 0.076 u[IU]/mL — AB (ref 0.350–4.500)

## 2018-09-09 LAB — DIGOXIN LEVEL: Digoxin Level: <0.2 ng/mL — ABNORMAL LOW (ref 0.8–2.0)

## 2018-09-09 LAB — PHOSPHORUS: Phosphorus: 3.7 mg/dL (ref 2.5–4.6)

## 2018-09-09 LAB — HEPARIN LEVEL (UNFRACTIONATED): Heparin Unfractionated: 0.42 IU/mL (ref 0.30–0.70)

## 2018-09-09 MED ORDER — SODIUM CHLORIDE 0.9 % IV BOLUS
1000.0000 mL | Freq: Once | INTRAVENOUS | Status: AC
  Administered 2018-09-09: 1000 mL via INTRAVENOUS

## 2018-09-09 MED ORDER — ONDANSETRON HCL 4 MG/2ML IJ SOLN: 4.0000 mg | Freq: Four times a day (QID) | INTRAMUSCULAR | Status: DC | PRN

## 2018-09-09 MED ORDER — SODIUM CHLORIDE 0.9 % IV SOLN
2.0000 g | INTRAVENOUS | Status: DC
  Administered 2018-09-10 – 2018-09-11 (×2): 2 g via INTRAVENOUS
  Filled 2018-09-09 (×2): qty 2

## 2018-09-09 MED ORDER — LACTATED RINGERS IV BOLUS
1000.0000 mL | Freq: Once | INTRAVENOUS | Status: AC
  Administered 2018-09-10: 1000 mL via INTRAVENOUS

## 2018-09-09 MED ORDER — INSULIN ASPART 100 UNIT/ML ~~LOC~~ SOLN: 0.0000 [IU] | Freq: Every day | SUBCUTANEOUS | Status: DC

## 2018-09-09 MED ORDER — NITROGLYCERIN 0.4 MG SL SUBL: 0.4000 mg | SUBLINGUAL_TABLET | SUBLINGUAL | Status: DC | PRN

## 2018-09-09 MED ORDER — ASPIRIN 300 MG RE SUPP: 300.0000 mg | RECTAL | Status: AC

## 2018-09-09 MED ORDER — LACTATED RINGERS IV SOLN
INTRAVENOUS | Status: DC
  Administered 2018-09-09 – 2018-09-10 (×2): via INTRAVENOUS

## 2018-09-09 MED ORDER — CLOPIDOGREL BISULFATE 75 MG PO TABS: 75.0000 mg | ORAL_TABLET | Freq: Every evening | ORAL | Status: DC

## 2018-09-09 MED ORDER — SODIUM CHLORIDE 0.9 % IV SOLN
1.0000 g | Freq: Once | INTRAVENOUS | Status: AC
  Administered 2018-09-09: 1 g via INTRAVENOUS
  Filled 2018-09-09 (×2): qty 1

## 2018-09-09 MED ORDER — AMIODARONE HCL IN DEXTROSE 360-4.14 MG/200ML-% IV SOLN
60.0000 mg/h | INTRAVENOUS | Status: AC
  Administered 2018-09-09: 60 mg/h via INTRAVENOUS
  Filled 2018-09-09: qty 200

## 2018-09-09 MED ORDER — HEPARIN BOLUS VIA INFUSION
4000.0000 [IU] | Freq: Once | INTRAVENOUS | Status: AC
  Administered 2018-09-09: 4000 [IU] via INTRAVENOUS
  Filled 2018-09-09: qty 4000

## 2018-09-09 MED ORDER — ASPIRIN 81 MG PO CHEW
324.0000 mg | CHEWABLE_TABLET | Freq: Once | ORAL | Status: AC
  Administered 2018-09-09: 324 mg via ORAL
  Filled 2018-09-09: qty 4

## 2018-09-09 MED ORDER — INSULIN ASPART 100 UNIT/ML ~~LOC~~ SOLN
0.0000 [IU] | Freq: Three times a day (TID) | SUBCUTANEOUS | Status: DC
  Administered 2018-09-09: 2 [IU] via SUBCUTANEOUS
  Filled 2018-09-09: qty 1

## 2018-09-09 MED ORDER — ASPIRIN EC 81 MG PO TBEC: 81.0000 mg | DELAYED_RELEASE_TABLET | Freq: Every evening | ORAL | Status: DC

## 2018-09-09 MED ORDER — AMIODARONE HCL IN DEXTROSE 360-4.14 MG/200ML-% IV SOLN
30.0000 mg/h | INTRAVENOUS | Status: DC
  Administered 2018-09-09 – 2018-09-11 (×4): 30 mg/h via INTRAVENOUS
  Filled 2018-09-09 (×4): qty 200

## 2018-09-09 MED ORDER — SODIUM CHLORIDE 0.9 % IV SOLN
1.0000 g | Freq: Once | INTRAVENOUS | Status: AC
  Administered 2018-09-09: 1 g via INTRAVENOUS
  Filled 2018-09-09: qty 1

## 2018-09-09 MED ORDER — ASPIRIN EC 81 MG PO TBEC: 81.0000 mg | DELAYED_RELEASE_TABLET | Freq: Every day | ORAL | Status: DC

## 2018-09-09 MED ORDER — VANCOMYCIN HCL IN DEXTROSE 1-5 GM/200ML-% IV SOLN
1000.0000 mg | INTRAVENOUS | Status: DC
  Administered 2018-09-09 – 2018-09-10 (×2): 1000 mg via INTRAVENOUS
  Filled 2018-09-09 (×3): qty 200

## 2018-09-09 MED ORDER — ASPIRIN 81 MG PO CHEW: 324.0000 mg | CHEWABLE_TABLET | ORAL | Status: AC

## 2018-09-09 MED ORDER — VANCOMYCIN HCL IN DEXTROSE 1-5 GM/200ML-% IV SOLN
1000.0000 mg | Freq: Once | INTRAVENOUS | Status: AC
  Administered 2018-09-09: 1000 mg via INTRAVENOUS
  Filled 2018-09-09: qty 200

## 2018-09-09 MED ORDER — MAGNESIUM SULFATE 2 GM/50ML IV SOLN
2.0000 g | Freq: Once | INTRAVENOUS | Status: AC
  Administered 2018-09-09: 2 g via INTRAVENOUS
  Filled 2018-09-09: qty 50

## 2018-09-09 MED ORDER — LEVOTHYROXINE SODIUM 50 MCG PO TABS: 175.0000 ug | ORAL_TABLET | Freq: Every day | ORAL | Status: DC

## 2018-09-09 MED ORDER — AMIODARONE LOAD VIA INFUSION
150.0000 mg | Freq: Once | INTRAVENOUS | Status: AC
  Administered 2018-09-09: 150 mg via INTRAVENOUS
  Filled 2018-09-09: qty 83.34

## 2018-09-09 MED ORDER — ACETAMINOPHEN 325 MG PO TABS: 650.0000 mg | ORAL_TABLET | ORAL | Status: DC | PRN

## 2018-09-09 MED ORDER — HEPARIN (PORCINE) 25000 UT/250ML-% IV SOLN
1150.0000 [IU]/h | INTRAVENOUS | Status: DC
  Administered 2018-09-09: 850 [IU]/h via INTRAVENOUS
  Administered 2018-09-10: 1000 [IU]/h via INTRAVENOUS
  Administered 2018-09-11: 1150 [IU]/h via INTRAVENOUS
  Filled 2018-09-09 (×3): qty 250

## 2018-09-09 MED ORDER — ROSUVASTATIN CALCIUM 10 MG PO TABS: 40.0000 mg | ORAL_TABLET | Freq: Every evening | ORAL | Status: DC

## 2018-09-09 MED ORDER — SODIUM CHLORIDE 0.9 % IV BOLUS
500.0000 mL | Freq: Once | INTRAVENOUS | Status: AC
  Administered 2018-09-09: 500 mL via INTRAVENOUS

## 2018-09-09 NOTE — ED Notes (Signed)
ED Provider at bedside. 

## 2018-09-09 NOTE — Consult Note (Signed)
Reason for Consult:Critical Care Management Referring Physician: Hospitalist Service  Mitchell Huffman is an 77 y.o. male.  HPI: Mitchell Huffman is a 77 year old gentleman with a past medical history remarkable for hypertension, hyperlipidemia, diabetes mellitus, CVA x2, throat cancer, status post tracheostomy, coronary artery disease, status post four-vessel bypass, carotid artery disease, status post left-sided stenting, atrial flutter, aortic stenosis who presented to the emergency department with shortness of breath and congestion over a 2 to 3-week timeframe.  He has had discolored sputum from his tracheostomy.  He denies any chills, fever but has had diaphoresis.  He complains of weakness status post falls x2.  In the emergency department patient was noted to be hypotensive and tachycardic.  Pertinent labs reveal a creatinine of 2.14, white count 12.6, lactic acid of 6.2 and an elevated troponin of 8.  Cardiology has been consulted and he was started empirically on heparin for an STEMI.  His EKG revealed sinus tachycardia, left axis deviation with ST segment depressions noted diffusely across the anterior precordial leads.  Past Medical History:  Diagnosis Date  . Aortic stenosis    a. TTE 04/2016: estimated Valve area (VTI): 0.63 cm^2. Difficult to determine in setting of flutter and reduced EF, visually AS appeared to moderate to severe  . Atrial flutter ([place])    a. originally diagnosed 09/2015; b. not on full-dose anticoagulation coming into the hospital; c. CHADS2VASc at least 6 (HTN, age, DM, stroke x 2, vascular disease)  . CAD (coronary artery disease)    a. s/p 4 vessel CABG in 2007, no interventions since  . Carotid artery disease ([place])    a. s/p left-sided stenting in 1995  . Chronic back pain   . Chronic systolic CHF (congestive heart failure) ([place])    a. echo 04/2016: EF 30-35%, could not exclude RWMA, not tech sufficient to allow for LV dia fxn, severe AS, mild bilateral enlargement, RV  mildly dilated with mildly reduced sys fxn  . Diabetes mellitus ([place])   . Hyperlipidemia   . Hypertension   . Stroke [facility])    a. x 2 in 1995  . 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 s/p tracheostmy and XRT in 2007    Past Surgical History:  Procedure Laterality Date  . BYPASS GRAFT    . CARDIAC CATHETERIZATION    . ELECTROPHYSIOLOGIC STUDY N/A 04/23/2016   Procedure: CARDIOVERSION;  Surgeon: Dionisio David, MD;  Location: [facility] ORS;  Service: Cardiovascular;  Laterality: N/A;  . TRACHEOSTOMY      Family History  Problem Relation Age of Onset  . Hypertension Father   . Hyperlipidemia Father     Social History:  reports that he quit smoking about 24 years ago. His smoking use included cigarettes. He started smoking about 69 years ago. He has a 405.00 pack-year smoking history. He does not have any smokeless tobacco history on file. He reports that he drinks about 4.0 standard drinks of alcohol per week. He reports that he does not use drugs.  Allergies: No Known Allergies  Medications: I have reviewed the patient's current medications.  Results for orders placed or performed during the hospital encounter of 09/14/2018 (from the past 48 hour(s))  Troponin I Once     Status: Abnormal   Collection Time: 09/17/2018  8:21 AM  Result Value Ref Range   Troponin I 8.03 (HH) <0.03 ng/mL    Comment: CRITICAL RESULT CALLED TO, READ BACK BY AND  VERIFIED WITH HUNTER ORE AT 0902 ON 09/02/2018 BY SNJ Performed at [facility], [address] [place], [place] [address]   Comprehensive metabolic panel     Status: Abnormal   Collection Time: 09/21/2018  8:21 AM  Result Value Ref Range   Sodium 137 135 - 145 mmol/L   Potassium 4.1 3.5 - 5.1 mmol/L   Chloride 98 98 - 111 mmol/L   CO2 23 22 - 32 mmol/L   Glucose, Bld 222 (H) 70 - 99 mg/dL   BUN 22 8 - 23 mg/dL   Creatinine, Ser 2.14 (H) 0.61 - 1.24 mg/dL   Calcium 9.2 8.9 - 10.3 mg/dL   Total Protein  8.2 (H) 6.5 - 8.1 g/dL   Albumin 4.0 3.5 - 5.0 g/dL   AST 140 (H) 15 - 41 U/L   ALT 40 0 - 44 U/L   Alkaline Phosphatase 70 38 - 126 U/L   Total Bilirubin 0.8 0.3 - 1.2 mg/dL   GFR calc non Af Amer 28 (L) >60 mL/min   GFR calc Af Amer 33 (L) >60 mL/min    Comment: (NOTE) The eGFR has been calculated using the CKD EPI equation. This calculation has not been validated in all clinical situations. eGFR's persistently <60 mL/min signify possible Chronic Kidney Disease.    Anion gap 16 (H) 5 - 15    Comment: Performed at [facility], [address] [place], [place] [address]  CBC with Differential     Status: Abnormal   Collection Time: 09/06/2018  8:21 AM  Result Value Ref Range   WBC 12.6 (H) 4.0 - 10.5 K/uL   RBC 4.25 4.22 - 5.81 MIL/uL   Hemoglobin 13.9 13.0 - 17.0 g/dL   HCT 40.7 39.0 - 52.0 %   MCV 95.8 80.0 - 100.0 fL   MCH 32.7 26.0 - 34.0 pg   MCHC 34.2 30.0 - 36.0 g/dL   RDW 12.3 11.5 - 15.5 %   Platelets 172 150 - 400 K/uL   nRBC 0.0 0.0 - 0.2 %   Neutrophils Relative % 89 %   Neutro Abs 11.3 (H) 1.7 - 7.7 K/uL   Lymphocytes Relative 3 %   Lymphs Abs 0.3 (L) 0.7 - 4.0 K/uL   Monocytes Relative 6 %   Monocytes Absolute 0.8 0.1 - 1.0 K/uL   Eosinophils Relative 1 %   Eosinophils Absolute 0.1 0.0 - 0.5 K/uL   Basophils Relative 0 %   Basophils Absolute 0.0 0.0 - 0.1 K/uL   WBC Morphology TOXIC GRANULATION    RBC Morphology MORPHOLOGY UNREMARKABLE    Smear Review MORPHOLOGY UNREMARKABLE    Immature Granulocytes 1 %   Abs Immature Granulocytes 0.08 (H) 0.00 - 0.07 K/uL    Comment: Performed at [facility], [address] [place], [place] [address]  Digoxin level     Status: Abnormal   Collection Time: 09/13/2018  8:21 AM  Result Value Ref Range   Digoxin Level <0.2 (L) 0.8 - 2.0 ng/mL    Comment: RESULTS CHECKED SNJ Performed at [facility], [address] [place], [place] [address]   Lactic acid, plasma     Status: Abnormal    Collection Time: 09/24/2018  8:21 AM  Result Value Ref Range   Lactic Acid, Venous 6.2 (HH) 0.5 - 1.9 mmol/L    Comment: CRITICAL RESULT CALLED TO, READ BACK BY AND VERIFIED WITH  HUNTER ORE AT 0856 ON 09/18/2018 BY SNJ Performed at [facility],  [place], [place] [address]   APTT     Status: None   Collection Time: 09/26/2018  8:21 AM  Result Value Ref Range   aPTT 33 24 - 36 seconds    Comment: Performed at [facility], [address] [place], [place] [address]  Protime-INR     Status: Abnormal   Collection Time: 09/14/2018  8:21 AM  Result Value Ref Range   Prothrombin Time 15.6 (H) 11.4 - 15.2 seconds   INR 1.25     Comment: Performed at [facility], [address] [place], [place] [address]  Lactic acid, plasma     Status: Abnormal   Collection Time: 09/01/2018 11:15 AM  Result Value Ref Range   Lactic Acid, Venous 2.9 (HH) 0.5 - 1.9 mmol/L    Comment: CRITICAL RESULT CALLED TO, READ BACK BY AND VERIFIED WITH HUNTER ORE AT 1153 ON 09/10/2018 BY SNJ Performed at [facility], [address] [place], [place] [address]   Urinalysis, Complete w Microscopic     Status: Abnormal   Collection Time: 09/21/2018 11:37 AM  Result Value Ref Range   Color, Urine AMBER (A) YELLOW    Comment: BIOCHEMICALS MAY BE AFFECTED BY COLOR   APPearance HAZY (A) CLEAR   Specific Gravity, Urine 1.015 1.005 - 1.030   pH 5.0 5.0 - 8.0   Glucose, UA >=500 (A) NEGATIVE mg/dL   Hgb urine dipstick MODERATE (A) NEGATIVE   Bilirubin Urine NEGATIVE NEGATIVE   Ketones, ur NEGATIVE NEGATIVE mg/dL   Protein, ur 100 (A) NEGATIVE mg/dL   Nitrite NEGATIVE NEGATIVE   Leukocytes, UA NEGATIVE NEGATIVE   RBC / HPF 0-5 0 - 5 RBC/hpf   WBC, UA 0-5 0 - 5 WBC/hpf   Bacteria, UA NONE SEEN NONE SEEN   Squamous Epithelial / LPF 0-5 0 - 5    Comment: Performed at [facility], [address], [place], [place] [address]    Dg Chest 2 View  Result Date:  09/27/2018 CLINICAL DATA:  Pt presents via EMS c/o SOB and falls x2. Pt has trach however removed by pt per EMS report. Green mucus noted on exam from trach. Pt has small abrasion noted to back of head s/p fall. Pt unable to talk at this time due to trach. Hx - Aortic stenosis, atrial flutter, CAD w/ CABG, CHF, diabetes, HTN, throat cancer, former smoker quit 1995, smoked 3 ppd. EXAM: CHEST - 2 VIEW COMPARISON:  5/30/7 FINDINGS: Stable changes from prior CABG surgery. Cardiac silhouette is mildly enlarged. No mediastinal or hilar masses. No convincing adenopathy. Lungs are clear.  No pleural effusion or pneumothorax. Skeletal structures are grossly intact. IMPRESSION: 1. No acute cardiopulmonary disease. 2. Cardiomegaly. Electronically Signed   By: Lajean Manes M.D.   On: 09/14/2018 08:43   Ct Head Wo Contrast  Result Date: 09/18/2018 CLINICAL DATA:  Falling over the last 2 days with trauma to the head. Shortness of breath. EXAM: CT HEAD WITHOUT CONTRAST TECHNIQUE: Contiguous axial images were obtained from the base of the skull through the vertex without intravenous contrast. COMPARISON:  06/28/2012 FINDINGS: Brain: Generalized atrophy. Chronic small-vessel ischemic changes of the thalami, basal ganglia and cerebral hemispheric white matter. No cortical or large vessel territory infarction. No evidence of mass lesion, hemorrhage, hydrocephalus or extra-axial collection. Vascular: There is atherosclerotic calcification of the major vessels at the base of the brain. Skull: Negative Sinuses/Orbits: Clear/normal Other: None IMPRESSION: No acute or traumatic finding. Atrophy and  chronic small-vessel ischemic changes throughout the brain. Electronically Signed   By: Nelson Chimes M.D.   On: 09/25/2018 10:39    ROS   Please see HPI for pertinent positives all else negative Blood pressure 92/68, pulse (!) 130, temperature 99.8 F (37.7 C), temperature source Oral, resp. rate (!) 29, height _0  (1.676 m),  weight 72.6 kg, SpO2 100 %. Physical Exam  Vital signs: Please see the above listed vital signs. Patient is awake, alert in no acute distress HEENT: Trachea midline, tracheostomy site noted, discolored sputum noted no jugular venous distention or accessory muscle utilization Cardiovascular: Tachycardia appreciated with systolic murmur 2/6 Pulmonary: Coarse rhonchi appreciated bilaterally Extremities: No clubbing, cyanosis or edema noted Abdominal: Positive bowel sounds, soft exam Cutaneous: No rashes or lesions noted  Assessment/Plan:  Respiratory distress.  Patient complaining of shortness of breath, chest x-ray is not that impressive but does have at least tracheobronchitis.  Is being started empirically on vancomycin and cefepime  NSTEMI.  Elevated troponin at 8.03, cardiology has been consulted, EKG reveals ST segment depressions, has been empirically started on heparin drip  Lactic acidosis.  Patient was hypotensive on admission, concerning for sepsis/shock area has responded to fluid bolus  Leukocytosis.  With toxic granulations concerning for sepsis      Mitchell Huffman 09/25/2018, 11:55 AM

## 2018-09-09 NOTE — ED Triage Notes (Signed)
Pt presents via EMS c/ SOB and falls x2. Pt has trach however removed by pt per EMS report. Green mucus noted on exam from trach. Pt has small abrasion noted to back of head s/p fall. Pt unable to talk at this time due to trach. Lord MD at bedside.

## 2018-09-09 NOTE — Progress Notes (Signed)
Pts BP 80's/60's, MD notified and liter bolus ordered. Will carry out MD orders and continue to monitor.

## 2018-09-09 NOTE — Progress Notes (Signed)
Sx. Pt. For moderate amt of thick yellow secretions through stoma.

## 2018-09-09 NOTE — Consult Note (Signed)
CARDIOLOGY CONSULT NOTE     Primary Care Physician: Jodi Marble, MD Referring Physician:  Dr Brett Albino  Admit Date: 09/03/2018  Reason for consultation:  CHF, Aortic stenosis, tachycardia  Mitchell Huffman is a 77 y.o. male with a h/o throat CA (stage IV), aortic stenosis, CHF, atrial flutter, and CAD who now presents with presumed sepsis with hypotension, tachycardia, acute renal failure, and lactic acidosis.  He has been admitted for further management. Though he has extensive cardiac disease, I do not see that he has received any outpatient care in the past 2 years.  He has seen Dr Fletcher Anon in the hospital previously. His family has noticed progressive weakness and fragility (per report).  He has had several recent falls. Due to trach, history from the patient is difficult.  Currently, denies chest pain, shortness of breath,  or neurologic sequela.    Past Medical History:  Diagnosis Date  . Aortic stenosis    a. TTE 04/2016: estimated Valve area (VTI): 0.63 cm^2. Difficult to determine in setting of flutter and reduced EF, visually AS appeared to moderate to severe  . Atrial flutter ([place])    a. originally diagnosed 09/2015; b. not on full-dose anticoagulation coming into the hospital; c. CHADS2VASc at least 6 (HTN, age, DM, stroke x 2, vascular disease)  . CAD (coronary artery disease)    a. s/p 4 vessel CABG in 2007, no interventions since  . Carotid artery disease ([place])    a. s/p left-sided stenting in 1995  . Chronic back pain   . Chronic systolic CHF (congestive heart failure) ([place])    a. echo 04/2016: EF 30-35%, could not exclude RWMA, not tech sufficient to allow for LV dia fxn, severe AS, mild bilateral enlargement, RV mildly dilated with mildly reduced sys fxn  . Diabetes mellitus ([place])   . Hyperlipidemia   . Hypertension   . Stroke [facility])    a. x 2 in 1995  . 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 s/p tracheostmy and XRT in 2007   Past  Surgical History:  Procedure Laterality Date  . BYPASS GRAFT    . CARDIAC CATHETERIZATION    . ELECTROPHYSIOLOGIC STUDY N/A 04/23/2016   Procedure: CARDIOVERSION;  Surgeon: Dionisio David, MD;  Location: [facility] ORS;  Service: Cardiovascular;  Laterality: N/A;  . TRACHEOSTOMY      . aspirin  324 mg Oral NOW   Or  . aspirin  300 mg Rectal NOW  . [START ON 09/10/2018] aspirin EC  81 mg Oral QPM  . clopidogrel  75 mg Oral QPM  . insulin aspart  0-5 Units Subcutaneous QHS  . insulin aspart  0-9 Units Subcutaneous TID WC  . [START ON 09/10/2018] levothyroxine  175 mcg Oral QAC breakfast  . rosuvastatin  40 mg Oral QPM   . ceFEPime (MAXIPIME) IV    . [START ON 09/10/2018] ceFEPime (MAXIPIME) IV    . heparin 850 Units/hr (09/24/2018 1116)  . vancomycin      No Known Allergies  Social History   Socioeconomic History  . Marital status: Married    Spouse name: Not on file  . Number of children: Not on file  . Years of education: Not on file  . Highest education level: Not on file  Occupational History  . Not on file  Social Needs  . Financial resource strain: Not on file  . Food insecurity:    Worry: Not on  file    Inability: Not on file  . Transportation needs:    Medical: Not on file    Non-medical: Not on file  Tobacco Use  . Smoking status: Former Smoker    Packs/day: 3.00    Years: 135.00    Pack years: 405.00    Types: Cigarettes    Start date: 11/01/1948    Last attempt to quit: 11/01/1993    Years since quitting: 24.8  Substance and Sexual Activity  . Alcohol use: Yes    Alcohol/week: 4.0 standard drinks    Types: 4 Standard drinks or equivalent per week  . Drug use: No  . Sexual activity: Not on file  Lifestyle  . Physical activity:    Days per week: Not on file    Minutes per session: Not on file  . Stress: Not on file  Relationships  . Social connections:    Talks on phone: Not on file    Gets together: Not on file    Attends religious service: Not on file      Active member of club or organization: Not on file    Attends meetings of clubs or organizations: Not on file    Relationship status: Not on file  . Intimate partner violence:    Fear of current or ex partner: Not on file    Emotionally abused: Not on file    Physically abused: Not on file    Forced sexual activity: Not on file  Other Topics Concern  . Not on file  Social History Narrative  . Not on file    Family History  Problem Relation Age of Onset  . Hypertension Father   . Hyperlipidemia Father     ROS- pt unable to participate due to trach and acute medical illness except as per the HPI above  Physical Exam: Telemetry:  Atrial flutter with 2:1 AV conduction Vitals:   09/25/2018 1115 09/26/2018 1130 09/04/2018 1145 09/23/2018 1233  BP: (!) 82/68 (!) 84/72 92/68 92/77   Pulse: (!) 130 (!) 130 (!) 130 (!) 130  Resp: (!) 27 (!) 27 (!) 29 (!) 33  Temp:    98 F (36.7 C)  TempSrc:    Oral  SpO2: 98% 99% 100% 100%  Weight:    76.6 kg  Height:    5\' 6"  (1.676 m)    GEN- The patient is very ill and frail appearing, sleeping but rouses Eyes-  Sclera clear, conjunctiva pink Ears- hearing intact Oropharynx- clear Neck-trach Lungs- Coarse breath sounds throughout Heart- tachycardic irregular rhythm, 2:6 SEM LUSB which is difficult to assess due to tachycardia, trach noises, and coarse BS GI- soft, NT, ND, + BS Extremities- no clubbing, cyanosis, or edema MS- diffuse atrophy Skin- + ecchymosis Psych- flat affect Neuro- strength and sensation are intact  EKG:  Atrial flutter with 2:1 conduction, ST depression noted  Labs:   Lab Results  Component Value Date   WBC 12.6 (H) 09/05/2018   HGB 13.9 09/24/2018   HCT 40.7 09/01/2018   MCV 95.8 09/22/2018   PLT 172 09/23/2018    Recent Labs  Lab 09/07/2018 0821  NA 137  K 4.1  CL 98  CO2 23  BUN 22  CREATININE 2.14*  CALCIUM 9.2  PROT 8.2*  BILITOT 0.8  ALKPHOS 70  ALT 40  AST 140*  GLUCOSE 222*   Lab Results   Component Value Date   CKTOTAL 42 06/26/2012   CKMB < 0.5 (L) 06/26/2012   TROPONINI  8.03 ([place]) 09/27/2018    Lab Results  Component Value Date   CHOL 101 03/31/2016   Lab Results  Component Value Date   HDL 60 03/31/2016   Lab Results  Component Value Date   LDLCALC 31 03/31/2016   Lab Results  Component Value Date   TRIG 51 03/31/2016   Lab Results  Component Value Date   CHOLHDL 1.7 03/31/2016   No results found for: LDLDIRECT      Echo: pending  ASSESSMENT AND PLAN:   1. Sepsis/ hypotension The patient is clinically very ill.  His prognosis is poor.  This is complicated by aortic stenosis, CHF, atrial flutter with RVR, acute renal failure, and elevated troponin.  Primary team to manage.  2. NSTEMI Likely due to RVR in the setting of sepsis, hypotension, and CHF. Continue IV heparin Continue ASA and plavix Currently pain free.   Poor candidate for CV interventions.  Would plan conservative management currently.  3. Typical atrial flutter with RVR Duration unknown.  Clearly complicating his medical condiction with known AS and CAD, now with sepsis and hypotension. Continue IV heparin chads2vasc score is at least 7.  He may be a poor candidate for anticoagulation long term. Start amiodarone for rate control  4. Aortic stenosis Has not been recently evaluated Difficult to assess on exam as noted above Echo pending Maintain preload with IVF  5. Known CAD S/p CABG Rate control atrial flutter Heparin drip Continue ASA and plavix Cannot use beta blockers currently due to hypotension  6. Ischemic CM EF 30% in 2017 Careful with IVF Echo pending  Critical care time was exclusive of separate billable procedures and treating other patients.  Critial care time was spent personally by me on the following activities: development of treatment plan with patient and/or surrogate as well as nursing,  evaluation of patients response to treatment, examining patient,  obtaining history from patient, ordering/ reviewing treatments/ interventions, lab studies, radiographic studies, pulse ox, and re-evaluation of patients condition.     The patient is critically ill with multiple organ systems failure and requires high complexity decision making for assessment and support, frequent evaluation and titration of therapies, application of advanced monitoring technologies and extensive interpretation of databases.   Critical care was necessary to treat or prevent immintent or life-threatening deterioration.  Total CCT spent directly with the patient today is 50 minutes  Thompson Grayer MD, [facility] 09/22/2018 1:51 PM

## 2018-09-09 NOTE — Progress Notes (Signed)
ANTICOAGULATION CONSULT NOTE - Initial Consult  Pharmacy Consult for heparin Indication: chest pain/ACS  No Known Allergies  Patient Measurements: Height: 5\' 6"  (167.6 cm) Weight: 168 lb 14 oz (76.6 kg) IBW/kg (Calculated) : 63.8 Heparin Dosing Weight: 72.6 kg  Vital Signs: Temp: 98 F (36.7 C) (11/09 1233) Temp Source: Oral (11/09 1233) BP: 92/77 (11/09 1233) Pulse Rate: 130 (11/09 1233)  Labs: Recent Labs    09/02/2018 0821 09/12/2018 1252  HGB 13.9  --   HCT 40.7  --   PLT 172  --   APTT 33  --   LABPROT 15.6*  --   INR 1.25  --   CREATININE 2.14*  --   TROPONINI 8.03* 23.15*    Estimated Creatinine Clearance: 28.2 mL/min (A) (by C-G formula based on SCr of 2.14 mg/dL (H)).    Assessment: 77 yo male here with possible NSTEMI and possible PNA Pt does not appear to be on oral anticoagulant per PTA med list  Heparin Course: 11/9 AM initiation 4000 unit bolus, then 850 units/hr 11/9 1905 HL 0.42  Goal of Therapy:  Heparin level 0.3-0.7 units/ml Monitor platelets by anticoagulation protocol: Yes   Plan:  Continue heparin at 850 units/hr Heparin level in 8h  CBC in AM  Pharmacy will continue to follow.   Dallie Piles, PharmD 09/01/2018,4:07 PM

## 2018-09-09 NOTE — Progress Notes (Signed)
Family Meeting Note  Advance Directive:no  Today a meeting took place with the Patient and spouse.  Patient is able to participate but nodding his head (he is unable to speak due to trach)   The following clinical team members were present during this meeting:MD  The following were discussed:Patient's diagnosis: , Patient's progosis: Unable to determine and Goals for treatment: Full Code   Discussed with patient, wife, and son that patient is critically ill with a history of multiple medical issues.  We are concerned that he is having a heart attack as well as possible sepsis secondary to pneumonia.  Family is adamant that they would like him to receive full aggressive medical care.  Additional follow-up to be provided: prn  Time spent during discussion:20 minutes  Evette Doffing, MD

## 2018-09-09 NOTE — Progress Notes (Signed)
Wife telephoned to say that she was upset that password  "pretty boy" was not entered. Password entered per patients wife request.

## 2018-09-09 NOTE — ED Notes (Signed)
Patient transported to X-ray 

## 2018-09-09 NOTE — ED Notes (Signed)
ED TO INPATIENT HANDOFF REPORT  Name/Age/Gender Mitchell Huffman 77 y.o. male  Code Status Code Status History    Date Active Date Inactive Code Status Order ID Comments User Context   03/30/2016 2323 04/06/2016 1508 Full Code [id number]  Quintella Baton, MD Inpatient      Home/SNF/Other Home  Chief Complaint Breathing Difficulty  Level of Care/Admitting Diagnosis ED Disposition    ED Disposition Condition [place]: [place] [[id number]]  Level of Care: Stepdown [14]  Diagnosis: NSTEMI (non-ST elevated myocardial infarction) [facility]) [242353]  Admitting Physician: Hyman Bible DODD [[id number]]  Attending Physician: Hyman Bible DODD [[id number]]  Estimated length of stay: past midnight tomorrow  Certification:: I certify this patient will need inpatient services for at least 2 midnights  PT Class (Do Not Modify): Inpatient [101]  PT Acc Code (Do Not Modify): Private [1]       Medical History Past Medical History:  Diagnosis Date  . Aortic stenosis    a. TTE 04/2016: estimated Valve area (VTI): 0.63 cm^2. Difficult to determine in setting of flutter and reduced EF, visually AS appeared to moderate to severe  . Atrial flutter ([place])    a. originally diagnosed 09/2015; b. not on full-dose anticoagulation coming into the hospital; c. CHADS2VASc at least 6 (HTN, age, DM, stroke x 2, vascular disease)  . CAD (coronary artery disease)    a. s/p 4 vessel CABG in 2007, no interventions since  . Carotid artery disease ([place])    a. s/p left-sided stenting in 1995  . Chronic back pain   . Chronic systolic CHF (congestive heart failure) ([place])    a. echo 04/2016: EF 30-35%, could not exclude RWMA, not tech sufficient to allow for LV dia fxn, severe AS, mild bilateral enlargement, RV mildly dilated with mildly reduced sys fxn  . Diabetes mellitus ([place])   . Hyperlipidemia   . Hypertension   . Stroke [facility])    a. x 2 in 1995  . 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 s/p tracheostmy and XRT in 2007    Allergies No Known Allergies  IV Location/Drains/Wounds Patient Lines/Drains/Airways Status   Active Line/Drains/Airways    Name:   Placement date:   Placement time:   Site:   Days:   Peripheral IV 09/22/2018 Right Antecubital   09/10/2018    0819    Antecubital   less than 1   Peripheral IV 09/19/2018 Left Antecubital   09/24/2018    0907    Antecubital   less than 1          Labs/Imaging Results for orders placed or performed during the hospital encounter of 09/07/2018 (from the past 48 hour(s))  Troponin I Once     Status: Abnormal   Collection Time: 09/10/2018  8:21 AM  Result Value Ref Range   Troponin I 8.03 (HH) <0.03 ng/mL    Comment: CRITICAL RESULT CALLED TO, READ BACK BY AND VERIFIED WITH Kyi Romanello AT 0902 ON 09/29/2018 BY SNJ Performed at [facility], [address] [place], [place] [address]   Comprehensive metabolic panel     Status: Abnormal   Collection Time: 09/20/2018  8:21 AM  Result Value Ref Range   Sodium 137 135 - 145 mmol/L   Potassium 4.1 3.5 - 5.1 mmol/L   Chloride 98 98 - 111 mmol/L   CO2 23 22 - 32 mmol/L  Glucose, Bld 222 (H) 70 - 99 mg/dL   BUN 22 8 - 23 mg/dL   Creatinine, Ser 2.14 (H) 0.61 - 1.24 mg/dL   Calcium 9.2 8.9 - 10.3 mg/dL   Total Protein 8.2 (H) 6.5 - 8.1 g/dL   Albumin 4.0 3.5 - 5.0 g/dL   AST 140 (H) 15 - 41 U/L   ALT 40 0 - 44 U/L   Alkaline Phosphatase 70 38 - 126 U/L   Total Bilirubin 0.8 0.3 - 1.2 mg/dL   GFR calc non Af Amer 28 (L) >60 mL/min   GFR calc Af Amer 33 (L) >60 mL/min    Comment: (NOTE) The eGFR has been calculated using the CKD EPI equation. This calculation has not been validated in all clinical situations. eGFR's persistently <60 mL/min signify possible Chronic Kidney Disease.    Anion gap 16 (H) 5 - 15    Comment: Performed at [facility], [address] [place], [place] [address]  CBC with Differential      Status: Abnormal   Collection Time: 09/15/2018  8:21 AM  Result Value Ref Range   WBC 12.6 (H) 4.0 - 10.5 K/uL   RBC 4.25 4.22 - 5.81 MIL/uL   Hemoglobin 13.9 13.0 - 17.0 g/dL   HCT 40.7 39.0 - 52.0 %   MCV 95.8 80.0 - 100.0 fL   MCH 32.7 26.0 - 34.0 pg   MCHC 34.2 30.0 - 36.0 g/dL   RDW 12.3 11.5 - 15.5 %   Platelets 172 150 - 400 K/uL   nRBC 0.0 0.0 - 0.2 %   Neutrophils Relative % 89 %   Neutro Abs 11.3 (H) 1.7 - 7.7 K/uL   Lymphocytes Relative 3 %   Lymphs Abs 0.3 (L) 0.7 - 4.0 K/uL   Monocytes Relative 6 %   Monocytes Absolute 0.8 0.1 - 1.0 K/uL   Eosinophils Relative 1 %   Eosinophils Absolute 0.1 0.0 - 0.5 K/uL   Basophils Relative 0 %   Basophils Absolute 0.0 0.0 - 0.1 K/uL   WBC Morphology TOXIC GRANULATION    RBC Morphology MORPHOLOGY UNREMARKABLE    Smear Review MORPHOLOGY UNREMARKABLE    Immature Granulocytes 1 %   Abs Immature Granulocytes 0.08 (H) 0.00 - 0.07 K/uL    Comment: Performed at [facility], [address] [place], [place] [address]  Digoxin level     Status: Abnormal   Collection Time: 09/06/2018  8:21 AM  Result Value Ref Range   Digoxin Level <0.2 (L) 0.8 - 2.0 ng/mL    Comment: RESULTS CHECKED SNJ Performed at [facility], [address] [place], [place] [address]   Lactic acid, plasma     Status: Abnormal   Collection Time: 09/13/2018  8:21 AM  Result Value Ref Range   Lactic Acid, Venous 6.2 (HH) 0.5 - 1.9 mmol/L    Comment: CRITICAL RESULT CALLED TO, READ BACK BY AND VERIFIED WITH  Kailo Kosik AT 0856 ON 09/26/2018 BY SNJ Performed at [facility], [address] [place], [place] [address]   APTT     Status: None   Collection Time: 09/25/2018  8:21 AM  Result Value Ref Range   aPTT 33 24 - 36 seconds    Comment: Performed at [facility], [address], [place], [place] [address]  Protime-INR     Status: Abnormal   Collection Time: 09/19/2018  8:21 AM  Result Value Ref Range   Prothrombin Time 15.6 (H)  11.4 - 15.2 seconds   INR 1.25     Comment: Performed at [facility], [address] [place], [place] [address]   Dg Chest 2 View  Result Date: 09/08/2018 CLINICAL DATA:  Pt presents via EMS c/o SOB and falls x2. Pt has trach however removed by pt per EMS report. Green mucus noted on exam from trach. Pt has small abrasion noted to back of head s/p fall. Pt unable to talk at this time due to trach. Hx - Aortic stenosis, atrial flutter, CAD w/ CABG, CHF, diabetes, HTN, throat cancer, former smoker quit 1995, smoked 3 ppd. EXAM: CHEST - 2 VIEW COMPARISON:  5/30/7 FINDINGS: Stable changes from prior CABG surgery. Cardiac silhouette is mildly enlarged. No mediastinal or hilar masses. No convincing adenopathy. Lungs are clear.  No pleural effusion or pneumothorax. Skeletal structures are grossly intact. IMPRESSION: 1. No acute cardiopulmonary disease. 2. Cardiomegaly. Electronically Signed   By: Lajean Manes M.D.   On: 09/08/2018 08:43   Ct Head Wo Contrast  Result Date: 09/03/2018 CLINICAL DATA:  Falling over the last 2 days with trauma to the head. Shortness of breath. EXAM: CT HEAD WITHOUT CONTRAST TECHNIQUE: Contiguous axial images were obtained from the base of the skull through the vertex without intravenous contrast. COMPARISON:  06/28/2012 FINDINGS: Brain: Generalized atrophy. Chronic small-vessel ischemic changes of the thalami, basal ganglia and cerebral hemispheric white matter. No cortical or large vessel territory infarction. No evidence of mass lesion, hemorrhage, hydrocephalus or extra-axial collection. Vascular: There is atherosclerotic calcification of the major vessels at the base of the brain. Skull: Negative Sinuses/Orbits: Clear/normal Other: None IMPRESSION: No acute or traumatic finding. Atrophy and chronic small-vessel ischemic changes throughout the brain. Electronically Signed   By: Nelson Chimes M.D.   On: 09/15/2018 10:39    Pending Labs Unresulted Labs (From  admission, onward)    Start     Ordered   09/10/18 0500  CBC  Tomorrow morning,   STAT     09/12/2018 0947   09/26/2018 1930  Heparin level (unfractionated)  Once-Timed,   STAT     09/24/2018 1119   09/16/2018 0908  Urinalysis, Complete w Microscopic  STAT - ONCE,   STAT     09/26/2018 0907   09/02/2018 0826  Culture, blood (routine x 2)  BLOOD CULTURE X 2,   STAT     09/12/2018 0825   09/24/2018 0826  Lactic acid, plasma  Now then every 2 hours,   STAT     09/22/2018 0825   Signed and Held  TSH  Once,   R     Signed and Held   Signed and Occupational hygienist morning,   R     Signed and Held   Visual merchandiser and Held  CBC  Tomorrow morning,   R     Signed and Held   Signed and Held  Troponin I Now Then Q6H  Now then every 6 hours,   STAT     Signed and Held   Visual merchandiser and Held  Brain natriuretic peptide  Once,   R     Signed and Held   Signed and Held  Lipid panel  Tomorrow morning,   R     Signed and Held   Signed and Held  Procalcitonin - Baseline  STAT - ONCE,   STAT     Signed and Held   Signed and Held  Procalcitonin  Daily,   R  Signed and Held   Signed and Held  Culture, respiratory (non-expectorated)  Once,   R    Question:  Patient immune status  Answer:  Normal   Signed and Held   Signed and Held  Hemoglobin A1c  Tomorrow morning,   R     Signed and Held          Vitals/Pain Today's Vitals   09/19/2018 0930 09/01/2018 0931 09/29/2018 0945 09/14/2018 1001  BP: 103/71  94/78 108/78  Pulse: (!) 133  (!) 130 (!) 131  Resp: 14     Temp:      TempSrc:      SpO2: 100%  100% 100%  Weight:  72.6 kg    Height:  5' 6"  (1.676 m)      Isolation Precautions No active isolations  Medications Medications  heparin bolus via infusion 4,000 Units (4,000 Units Intravenous Bolus from Bag 09/15/2018 1116)    Followed by  heparin ADULT infusion 100 units/mL (25000 units/253m sodium chloride 0.45%) (850 Units/hr Intravenous New Bag/Given 09/18/2018 1116)  sodium chloride 0.9 % bolus 1,000 mL  (1,000 mLs Intravenous New Bag/Given 09/14/2018 1141)  sodium chloride 0.9 % bolus 500 mL (0 mLs Intravenous Stopped 09/24/2018 0916)  sodium chloride 0.9 % bolus 1,000 mL (0 mLs Intravenous Stopped 09/03/2018 1008)  vancomycin (VANCOCIN) IVPB 1000 mg/200 mL premix (0 mg Intravenous Stopped 09/29/2018 1113)  ceFEPIme (MAXIPIME) 1 g in sodium chloride 0.9 % 100 mL IVPB (0 g Intravenous Stopped 09/15/2018 0950)  aspirin chewable tablet 324 mg (324 mg Oral Given 09/17/2018 0933)    Mobility {Mobility:20148

## 2018-09-09 NOTE — ED Notes (Signed)
Pt unable to give history due to being unable to talk.

## 2018-09-09 NOTE — ED Notes (Signed)
Holding Heparin until CT head resulted per Reita Cliche MD.

## 2018-09-09 NOTE — ED Provider Notes (Addendum)
[facility] Emergency Department Provider Note ____________________________________________   I have reviewed the triage vital signs and the triage nursing note.  HISTORY  Chief Complaint Shortness of Breath   Historian Level 5 Caveat History Limited by nonverbal secondary to cough and old trach site.  EMS gave some history. Patient answers by mouthing some answers as well as yes and no questions  HPI Mitchell Huffman is a 77 y.o. male with history of aortic stenosis, atrial flutter, coronary artery disease, history of trach secondary to throat cancer by chart history in 2007, presenting for cough for several days.  He answers that he does not live alone and that someone is coming here to the hospital so hopefully can get more history.  When asked when his trach was taken out, he mouths 2 years ago.  History from EMS was that patient has had 2 falls in the last 2 days.  Patient shakes his head no to question whether not he is had a fever.  Shakes head no no, no pain.     Past Medical History:  Diagnosis Date  . Aortic stenosis    a. TTE 04/2016: estimated Valve area (VTI): 0.63 cm^2. Difficult to determine in setting of flutter and reduced EF, visually AS appeared to moderate to severe  . Atrial flutter ([place])    a. originally diagnosed 09/2015; b. not on full-dose anticoagulation coming into the hospital; c. CHADS2VASc at least 6 (HTN, age, DM, stroke x 2, vascular disease)  . CAD (coronary artery disease)    a. s/p 4 vessel CABG in 2007, no interventions since  . Carotid artery disease ([place])    a. s/p left-sided stenting in 1995  . Chronic back pain   . Chronic systolic CHF (congestive heart failure) ([place])    a. echo 04/2016: EF 30-35%, could not exclude RWMA, not tech sufficient to allow for LV dia fxn, severe AS, mild bilateral enlargement, RV mildly dilated with mildly reduced sys fxn  . Diabetes mellitus ([place])   . Hyperlipidemia   . Hypertension    . Stroke [facility])    a. x 2 in 1995  . Throat cancer ([place])    a. 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 s/p tracheostmy and XRT in 2007    Patient Active Problem List   Diagnosis Date Noted  . NSTEMI (non-ST elevated myocardial infarction) ([place]) 09/12/2018  . Atrial flutter ([place])   . Throat cancer ([place])   . Atrial flutter with rapid ventricular response ([place]) 03/30/2016  . Hypotension 03/30/2016  . CAD (coronary artery disease) 03/30/2016  . Diabetes mellitus ([place]) 03/30/2016  . H/O: CVA (cerebrovascular accident) 03/30/2016  . Dyslipidemia 03/30/2016  . HTN (hypertension) 03/30/2016    Past Surgical History:  Procedure Laterality Date  . BYPASS GRAFT    . CARDIAC CATHETERIZATION    . ELECTROPHYSIOLOGIC STUDY N/A 04/23/2016   Procedure: CARDIOVERSION;  Surgeon: Dionisio David, MD;  Location: [facility] ORS;  Service: Cardiovascular;  Laterality: N/A;  . TRACHEOSTOMY      Prior to Admission medications   Medication Sig Start Date End Date Taking? Authorizing Provider  aspirin EC 81 MG tablet Take 81 mg by mouth every evening.   Yes [provider]  clopidogrel (PLAVIX) 75 MG tablet Take 75 mg by mouth every evening.    Yes [provider]  CRESTOR 40 MG tablet Take 1 tablet (40 mg total) by mouth every evening. 04/13/16  Yes Wellington Hampshire, MD  levothyroxine (SYNTHROID, LEVOTHROID) 175 MCG tablet Take 175 mcg by mouth daily before breakfast.    Yes [provider]  losartan (COZAAR) 50 MG tablet Take 50 mg by mouth every evening.    Yes [provider]  metFORMIN (GLUCOPHAGE) 500 MG tablet Take 500 mg by mouth daily.    Yes [provider]  Multiple Vitamins-Minerals (MULTIVITAMIN WITH MINERALS) tablet Take 1 tablet by mouth every evening.    Yes [provider]  digoxin (LANOXIN) 0.125 MG tablet Take 1 tablet (0.125 mg total) by mouth daily. Patient not taking: Reported on 04/20/2016 04/06/16   Dustin Flock, MD   metoprolol (LOPRESSOR) 100 MG tablet Take 1 tablet (100 mg total) by mouth 3 (three) times daily. Patient taking differently: Take 100 mg by mouth daily.  04/06/16   Dustin Flock, MD    No Known Allergies  Family History  Problem Relation Age of Onset  . Hypertension Father   . Hyperlipidemia Father     Social History Social History   Tobacco Use  . Smoking status: Former Smoker    Packs/day: 3.00    Years: 135.00    Pack years: 405.00    Types: Cigarettes    Start date: 11/01/1948    Last attempt to quit: 11/01/1993    Years since quitting: 24.8  Substance Use Topics  . Alcohol use: Yes    Alcohol/week: 4.0 standard drinks    Types: 4 Standard drinks or equivalent per week  . Drug use: No    Review of Systems  Constitutional: Negative for fever. Eyes: Negative for red eyes. ENT: Negative for sore throat. Cardiovascular: Negative for chest pain. Respiratory: For cough and sputum production, coming yellow from trach site.   Gastrointestinal: Negative for abdominal pain, vomiting and diarrhea. Genitourinary: Negative for dysuria. Musculoskeletal: Negative for back pain. Skin: Negative for rash. Neurological: Negative for headache.  ____________________________________________   PHYSICAL EXAM:  VITAL SIGNS: ED Triage Vitals  Enc Vitals Group     BP      Pulse      Resp      Temp      Temp src      SpO2      Weight      Height      Head Circumference      Peak Flow      Pain Score      Pain Loc      Pain Edu?      Excl. in [place]?      Constitutional: Alert and operative.  HEENT      Head: Normocephalic and atraumatic.      Eyes: Conjunctivae are normal. Pupils equal and round.       Ears:         Nose: No congestion/rhinnorhea.      Mouth/Throat: Mucous membranes are mildly dry.      Neck: No stridor.  Open trach site with yellow thick phlegm when he coughs. Cardiovascular/Chest: Tachycardic rate, regular rhythm.  No murmurs, rubs, or  gallops. Respiratory: Normal respiratory effort without tachypnea nor retractions.  Decreased breath sounds throughout, rhonchi throughout. Gastrointestinal: Soft. No distention, no guarding, no rebound. Nontender.    Genitourinary/rectal:Deferred Musculoskeletal: Nontender with normal range of motion in all extremities. No joint effusions.  No lower extremity tenderness.  No edema. Neurologic:  Normal speech and language. No gross or focal neurologic deficits are appreciated. Skin:  Skin is warm, dry and intact. No rash noted. Psychiatric: Calm and  cooperative   ____________________________________________  LABS (pertinent positives/negatives) I, Lisa Roca, MD the attending physician have reviewed the labs noted below.  Labs Reviewed  TROPONIN I - Abnormal; Notable for the following components:      Result Value   Troponin I 8.03 (*)    All other components within normal limits  COMPREHENSIVE METABOLIC PANEL - Abnormal; Notable for the following components:   Glucose, Bld 222 (*)    Creatinine, Ser 2.14 (*)    Total Protein 8.2 (*)    AST 140 (*)    GFR calc non Af Amer 28 (*)    GFR calc Af Amer 33 (*)    Anion gap 16 (*)    All other components within normal limits  CBC WITH DIFFERENTIAL/PLATELET - Abnormal; Notable for the following components:   WBC 12.6 (*)    Neutro Abs 11.3 (*)    Lymphs Abs 0.3 (*)    Abs Immature Granulocytes 0.08 (*)    All other components within normal limits  DIGOXIN LEVEL - Abnormal; Notable for the following components:   Digoxin Level <0.2 (*)    All other components within normal limits  LACTIC ACID, PLASMA - Abnormal; Notable for the following components:   Lactic Acid, Venous 6.2 (*)    All other components within normal limits  PROTIME-INR - Abnormal; Notable for the following components:   Prothrombin Time 15.6 (*)    All other components within normal limits  CULTURE, BLOOD (ROUTINE X 2)  CULTURE, BLOOD (ROUTINE X 2)  APTT   LACTIC ACID, PLASMA  URINALYSIS, COMPLETE (UACMP) WITH MICROSCOPIC  HEPARIN LEVEL (UNFRACTIONATED)    ____________________________________________    EKG I, Lisa Roca, MD, the attending physician have personally viewed and interpreted all ECGs.  129 beats minute.  Sinus tachycardia.  First-degree AV block.  Nonspecific intraventricular conduction delay.  ST segment depression inferiorly and laterally.  Inferolateral depressions are new from 2 years ago ____________________________________________  RADIOLOGY   CXR: Radiologist interpretation: FINDINGS: Stable changes from prior CABG surgery. Cardiac silhouette is mildly enlarged. No mediastinal or hilar masses. No convincing adenopathy.  Lungs are clear.  No pleural effusion or pneumothorax.  Skeletal structures are grossly intact.  IMPRESSION: 1. No acute cardiopulmonary disease. 2. Cardiomegaly.  CT head without contrast: IMPRESSION: No acute or traumatic finding. Atrophy and chronic small-vessel ischemic changes throughout the brain. __________________________________________  PROCEDURES  Procedure(s) performed: None  Procedures  Critical Care performed: CRITICAL CARE Performed by: Lisa Roca   Total critical care time: 30 minutes  Critical care time was exclusive of separately billable procedures and treating other patients.  Critical care was necessary to treat or prevent imminent or life-threatening deterioration.  Critical care was time spent personally by me on the following activities: development of treatment plan with patient and/or surrogate as well as nursing, discussions with consultants, evaluation of patient's response to treatment, examination of patient, obtaining history from patient or surrogate, ordering and performing treatments and interventions, ordering and review of laboratory studies, ordering and review of radiographic studies, pulse oximetry and re-evaluation of patient's  condition.    ____________________________________________  ED COURSE / ASSESSMENT AND PLAN  Pertinent labs & imaging results that were available during my care of the patient were reviewed by me and considered in my medical decision making (see chart for details).    Patient arrived with cough for several days, low-grade temperature here, tachycardic, concerning for possible pneumonia.  Chest x-ray without obvious infiltrate, however given hypotension and tachycardia  with cough, will initiate sepsis protocol.  Patient started on broad-spectrum antibiotics.  EKG shows some ischemic ST segment depressions inferiorly and laterally which are new from previous.  Troponin came back elevated to 8.  Based on this patient was started on heparin for an STEMI.  Laboratory studies indicate acute renal failure.  He does have an elevated white blood cell count.  Uncertain where the etiology of the possible sepsis, but again he is on broad-spectrum robotics.  No went urine sample obtained yet.  Cultures and urine cultures have been sent.  Discussed with hospitalist for admission.  Update the family and patient.      CONSULTATIONS:   Hospitalist for admission.   Patient / Family / Caregiver informed of clinical course, medical decision-making process, and agree with plan.    ___________________________________________   FINAL CLINICAL IMPRESSION(S) / ED DIAGNOSES   Final diagnoses:  NSTEMI (non-ST elevated myocardial infarction) ([place])  Acute kidney injury ([place])  Sepsis, due to unspecified organism, unspecified whether acute organ dysfunction present ([place])      ___________________________________________         Note: This dictation was prepared with Dragon dictation. Any transcriptional errors that result from this process are unintentional    Lisa Roca, MD 09/14/2018 0865    Lisa Roca, MD 09/17/2018 1113

## 2018-09-09 NOTE — Consult Note (Signed)
Mitchell Huffman NOTE  Pharmacy Consult for Drug-Drug Interaction Indication: Amiodarone   No Known Allergies  Patient Measurements: Height: 5\' 6"  (167.6 cm) Weight: 168 lb 14 oz (76.6 kg) IBW/kg (Calculated) : 63.8  Vital Signs: Temp: 98 F (36.7 C) (11/09 1233) Temp Source: Oral (11/09 1233) BP: 92/77 (11/09 1233) Pulse Rate: 130 (11/09 1233) Intake/Output from previous day: No intake/output data recorded. Intake/Output from this shift: No intake/output data recorded. Vent settings for last 24 hours:    Medications:  Scheduled:  . amiodarone  150 mg Intravenous Once  . aspirin  324 mg Oral NOW   Or  . aspirin  300 mg Rectal NOW  . [START ON 09/10/2018] aspirin EC  81 mg Oral QPM  . clopidogrel  75 mg Oral QPM  . insulin aspart  0-5 Units Subcutaneous QHS  . insulin aspart  0-9 Units Subcutaneous TID WC  . [START ON 09/10/2018] levothyroxine  175 mcg Oral QAC breakfast  . rosuvastatin  40 mg Oral QPM    Assessment/Plan: Drug Level D interactions identified amongst all scheduled and prn medications:  Heparin with plavix: increased bleeding risk. Will monitor.  Ondansetron and Amiodarone: Increased potential for QTc prolongation; however, Ondansetron is a prn medication, will continue to monitor. QTc 11/9 474.   Paticia Stack, PharmD Pharmacy Resident  09/20/2018 3:06 PM

## 2018-09-09 NOTE — Progress Notes (Signed)
ANTICOAGULATION CONSULT NOTE - Initial Consult  Pharmacy Consult for heparin Indication: chest pain/ACS  No Known Allergies  Patient Measurements: Height: 5\' 6"  (167.6 cm) Weight: 160 lb (72.6 kg) IBW/kg (Calculated) : 63.8 Heparin Dosing Weight: 72.6 kg  Vital Signs: Temp: 99.8 F (37.7 C) (11/09 0822) Temp Source: Oral (11/09 0822) BP: 103/71 (11/09 0930) Pulse Rate: 133 (11/09 0930)  Labs: Recent Labs    09/20/2018 0821  HGB 13.9  HCT 40.7  PLT 172  CREATININE 2.14*  TROPONINI 8.03*    Estimated Creatinine Clearance: 26.1 mL/min (A) (by C-G formula based on SCr of 2.14 mg/dL (H)).    Assessment: 76 yo male here with possible NSTEMI and possible PNA Pt does not appear to be on oral anticoagulant per PTA med list  Goal of Therapy:  Heparin level 0.3-0.7 units/ml Monitor platelets by anticoagulation protocol: Yes   Plan:  Add on baseline aPTT and INR Heparin bolus 4000 units IV x1 and then heparin drip 850 units/hr Heparin level 8h after start of drip CBC in AM  Pharmacy will continue to follow.    Rayna Sexton L 09/13/2018,9:39 AM

## 2018-09-09 NOTE — Progress Notes (Signed)
CODE SEPSIS - PHARMACY COMMUNICATION  **Broad Spectrum Antibiotics should be administered within 1 hour of Sepsis diagnosis**  Time Code Sepsis Called/Page Received: 0900  Antibiotics Ordered: vanc/cefepime  Time of 1st antibiotic administration: 0914  Additional action taken by pharmacy:   If necessary, Name of Provider/Nurse Contacted:     Ramond Dial ,PharmD Clinical Pharmacist  09/17/2018  9:01 AM

## 2018-09-09 NOTE — Progress Notes (Signed)
Pt admitted to room ICU 6 from the ED. Pt alert and placed on the monitor. Pt nods to answer questions appropriately. Pt receiving fluid bolus that ED started, BP 91/74. Respiratory at bedside; suctioned pt and placed a trach collar. Pt appears to have poor hygiene. Pt resting with no complaints.

## 2018-09-09 NOTE — Progress Notes (Signed)
#  6 Shiley cuffless trach inserted into stoma site per MD order without difficulty.

## 2018-09-09 NOTE — ED Notes (Signed)
Patient transported to CT 

## 2018-09-09 NOTE — Progress Notes (Signed)
Pt. sx'd for mod. Amt. Of thin brown secretions.

## 2018-09-09 NOTE — Consult Note (Signed)
Pharmacy Antibiotic Note  Mitchell Huffman is a 77 y.o. male admitted on 09/05/2018 with sepsis secondary to PNA and NSTEMI.  Pharmacy has been consulted for Cefepime + Vancomycin dosing.  Plan: Patient received Cefepime 1 g IV in ED, ordered 1 g x one for today. Ordered 2 g IV q24h starting 11/10.  Received Vancomycin 1 g IV x 1 in ED. Ordered Vancomycin 1000 mg IV q24 starting 6 hours after ED dose. Will order trough for 11/11 @ 1430, prior to 4th dose. Goal trough 15-20.  Vancomycin Kinetics Using actual BW 76.6 kg, CrCl 28.2 mL/min ke 0.028 Vd 53.6 L T1/2 24.8 hrs  Height: 5\' 6"  (167.6 cm) Weight: 168 lb 14 oz (76.6 kg) IBW/kg (Calculated) : 63.8  Temp (24hrs), Avg:98.9 F (37.2 C), Min:98 F (36.7 C), Max:99.8 F (37.7 C)  Recent Labs  Lab 09/27/2018 0821 09/18/2018 1115  WBC 12.6*  --   CREATININE 2.14*  --   LATICACIDVEN 6.2* 2.9*    Estimated Creatinine Clearance: 28.2 mL/min (A) (by C-G formula based on SCr of 2.14 mg/dL (H)).    No Known Allergies  Antimicrobials this admission: 11/9 Cefepime >>  11/9 Vancomycin >>   Dose adjustments this admission: N/A  Microbiology results: 11/9 BCx: pending 11/9 Sputum: pending  11/9 MRSA PCR: pending  Thank you for allowing pharmacy to be a part of this patient's care.   Paticia Stack, PharmD Pharmacy Resident  09/04/2018 1:15 PM

## 2018-09-09 NOTE — Progress Notes (Signed)
Pt arrived to floor with open stoma, no trach. Pt family stated that he lost his trach a few days ago. Received pt on NRB @5L . Pt placed on humidified trach collar at 24% 5L. Pt stoma full with dark tan secretions. Stoma site cleaned and suctioned for copious thick dark tan secretions.

## 2018-09-09 NOTE — H&P (Addendum)
[facility] NAME: Mitchell Huffman    MR#:  [id number]  DATE OF BIRTH:  04-06-1941  DATE OF ADMISSION:  09/07/2018  PRIMARY CARE PHYSICIAN: Jodi Marble, MD   REQUESTING/REFERRING PHYSICIAN: Lisa Roca, MD  CHIEF COMPLAINT:   Chief Complaint  Patient presents with  . Shortness of Breath    HISTORY OF PRESENT ILLNESS:  Mitchell Huffman  is a 77 y.o. male with a known history of moderate to severe aortic stenosis, paroxysmal atrial flutter, CAD s/p 4 vessel CABG, chronic systolic CHF, Q2WL, HTN, HLD, hx stroke x 2, throat cancer with trach who presented to the ED with shortness of breath and congestion for the last 2 to 3 weeks.  He has had a lot of clear/brown tracheal discharge.  He has been around his grandson who has been recently diagnosed with rhinovirus.  No fevers or chills at home, but family has noticed that he has been sweaty.  He is also fallen twice on his bottom in the last couple of days.  He seems much weaker than normal.  At baseline he uses a walker at home.  In the ED, he was hypotensive to the 80s/60s and tachycardic to the 130s.  Labs were significant for creatinine 2.14, WBC 12.6, lactic acid 6.2, troponin 8.  Chest x-ray and CT head were negative.  He was started on heparin for presumed NSTEMI.  Code sepsis was also called due to his tachycardia and leukocytosis.  He was started on vancomycin and cefepime.  He was given a 1.5L bolus.  Hospitalists were called for admission.  PAST MEDICAL HISTORY:   Past Medical History:  Diagnosis Date  . Aortic stenosis    a. TTE 04/2016: estimated Valve area (VTI): 0.63 cm^2. Difficult to determine in setting of flutter and reduced EF, visually AS appeared to moderate to severe  . Atrial flutter ([place])    a. originally diagnosed 09/2015; b. not on full-dose anticoagulation coming into the hospital; c. CHADS2VASc at least 6 (HTN, age, DM, stroke x 2, vascular disease)  . CAD  (coronary artery disease)    a. s/p 4 vessel CABG in 2007, no interventions since  . Carotid artery disease ([place])    a. s/p left-sided stenting in 1995  . Chronic back pain   . Chronic systolic CHF (congestive heart failure) ([place])    a. echo 04/2016: EF 30-35%, could not exclude RWMA, not tech sufficient to allow for LV dia fxn, severe AS, mild bilateral enlargement, RV mildly dilated with mildly reduced sys fxn  . Diabetes mellitus ([place])   . Hyperlipidemia   . Hypertension   . Stroke [facility])    a. x 2 in 1995  . 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 s/p tracheostmy and XRT in 2007    PAST SURGICAL HISTORY:   Past Surgical History:  Procedure Laterality Date  . BYPASS GRAFT    . CARDIAC CATHETERIZATION    . ELECTROPHYSIOLOGIC STUDY N/A 04/23/2016   Procedure: CARDIOVERSION;  Surgeon: Dionisio David, MD;  Location: [facility] ORS;  Service: Cardiovascular;  Laterality: N/A;  . TRACHEOSTOMY      SOCIAL HISTORY:   Social History   Tobacco Use  . Smoking status: Former Smoker    Packs/day: 3.00    Years: 135.00    Pack years: 405.00    Types: Cigarettes    Start date: 11/01/1948  Last attempt to quit: 11/01/1993    Years since quitting: 24.8  Substance Use Topics  . Alcohol use: Yes    Alcohol/week: 4.0 standard drinks    Types: 4 Standard drinks or equivalent per week    FAMILY HISTORY:   Family History  Problem Relation Age of Onset  . Hypertension Father   . Hyperlipidemia Father     DRUG ALLERGIES:  No Known Allergies  REVIEW OF SYSTEMS:   ROS-unable to obtain, patient with trach in place  MEDICATIONS AT HOME:   Prior to Admission medications   Medication Sig Start Date End Date Taking? Authorizing Provider  aspirin EC 81 MG tablet Take 81 mg by mouth every evening.   Yes [provider]  clopidogrel (PLAVIX) 75 MG tablet Take 75 mg by mouth every evening.    Yes [provider]  CRESTOR 40 MG tablet Take  1 tablet (40 mg total) by mouth every evening. 04/13/16  Yes Wellington Hampshire, MD  levothyroxine (SYNTHROID, LEVOTHROID) 175 MCG tablet Take 175 mcg by mouth daily before breakfast.    Yes [provider]  losartan (COZAAR) 50 MG tablet Take 50 mg by mouth every evening.    Yes [provider]  metFORMIN (GLUCOPHAGE) 500 MG tablet Take 500 mg by mouth daily.    Yes [provider]  Multiple Vitamins-Minerals (MULTIVITAMIN WITH MINERALS) tablet Take 1 tablet by mouth every evening.    Yes [provider]  digoxin (LANOXIN) 0.125 MG tablet Take 1 tablet (0.125 mg total) by mouth daily. Patient not taking: Reported on 04/20/2016 04/06/16   Dustin Flock, MD  metoprolol (LOPRESSOR) 100 MG tablet Take 1 tablet (100 mg total) by mouth 3 (three) times daily. Patient taking differently: Take 100 mg by mouth daily.  04/06/16   Dustin Flock, MD      VITAL SIGNS:  Blood pressure 94/78, pulse (!) 130, temperature 99.8 F (37.7 C), temperature source Oral, resp. rate 14, height 5\' 6"  (1.676 m), weight 72.6 kg, SpO2 100 %.  PHYSICAL EXAMINATION:  Physical Exam  GENERAL:  77 y.o.-year-old patient lying in the bed with no acute distress.  EYES: Pupils equal, round, reactive to light and accommodation. No scleral icterus. Extraocular muscles intact.  HEENT: Head atraumatic, normocephalic. Oropharynx and nasopharynx clear.  Mucous membranes NECK:  Supple, no jugular venous distention. No thyroid enlargement, no tenderness.  Trach in place with yellow/green sputum. LUNGS: Normal breath sounds bilaterally, no wheezing, rales,rhonchi or crepitation. No use of accessory muscles of respiration.  CARDIOVASCULAR: Tachycardic, regular rhythm, S1, S2 normal. No murmurs, rubs, or gallops.  ABDOMEN: Soft, nontender, nondistended. Bowel sounds present. No organomegaly or mass.  EXTREMITIES: No pedal edema, cyanosis, or clubbing.  NEUROLOGIC: Cranial nerves II through XII are intact.  +global weakness. Sensation intact. Gait not checked.  PSYCHIATRIC: The patient is alert and oriented x 3.  SKIN: No obvious rash, lesion, or ulcer.   LABORATORY PANEL:   CBC Recent Labs  Lab 09/10/2018 0821  WBC 12.6*  HGB 13.9  HCT 40.7  PLT 172   ------------------------------------------------------------------------------------------------------------------  Chemistries  Recent Labs  Lab 09/29/2018 0821  NA 137  K 4.1  CL 98  CO2 23  GLUCOSE 222*  BUN 22  CREATININE 2.14*  CALCIUM 9.2  AST 140*  ALT 40  ALKPHOS 70  BILITOT 0.8   ------------------------------------------------------------------------------------------------------------------  Cardiac Enzymes Recent Labs  Lab 09/08/2018 0821  TROPONINI 8.03*   ------------------------------------------------------------------------------------------------------------------  RADIOLOGY:  Dg Chest 2 View  Result Date: 09/10/2018 CLINICAL DATA:  Pt presents via EMS c/o SOB and falls x2. Pt has trach however removed by pt per EMS report. Green mucus noted on exam from trach. Pt has small abrasion noted to back of head s/p fall. Pt unable to talk at this time due to trach. Hx - Aortic stenosis, atrial flutter, CAD w/ CABG, CHF, diabetes, HTN, throat cancer, former smoker quit 1995, smoked 3 ppd. EXAM: CHEST - 2 VIEW COMPARISON:  5/30/7 FINDINGS: Stable changes from prior CABG surgery. Cardiac silhouette is mildly enlarged. No mediastinal or hilar masses. No convincing adenopathy. Lungs are clear.  No pleural effusion or pneumothorax. Skeletal structures are grossly intact. IMPRESSION: 1. No acute cardiopulmonary disease. 2. Cardiomegaly. Electronically Signed   By: Lajean Manes M.D.   On: 09/17/2018 08:43      IMPRESSION AND PLAN:   Sepsis- may be secondary to CAP, given his congestion, shortness of breath, and green/brown tracheal aspirate.  Patient with leukocytosis and tachycardia on admission. -Continue vancomycin  and cefepime -Check procalcitonin -Blood cultures pending -Trach aspirate culture pending -Repeat CXR in the morning  Elevated troponin- NSTEMI vs demand ischemia in the setting of sepsis and AKI. Initial troponin 8.  Not having any active chest pain.  Does have a history of CAD status post CABG x4. -Continue heparin drip -Cardiology consult -Continue daily aspirin and plavix -Holding beta-blocker due to hypotension -ECHO -Trend troponins  Lactic acidosis- likely related to above conditions -s/p 1.5L bolus -Trend lactic acid  Acute kidney injury- likely prerenal in the setting of hypotension -Holding losartan -Avoid nephrotoxic agents -Recheck BMP in the morning  Sinus tachycardia with a history of paroxysmal atrial flutter- not on anticoagulation at home. Previously on digoxin at home, but patient no longer taking this -Holding beta blocker due to hypotension -Cardiology consult  Hypotension with a history of hypertension- BPs in the 80s/60s -Holding losartan and metoprolol -May need pressors  Severe aortic stenosis- seen on last ECHO from 2017. -Repeat ECHO  Chronic systolic CHF-does not appear volume overloaded. Last ECHO with EF 30-35%. -ECHO -Check BNP -Has received 1.5L bolus, so will need to monitor respiratory status closely.  T2DM- hyperglycemic in the ED -Sensitive SSI -Check A1c  Hyperlipidemia- stable -Continue Crestor -Check lipid panel  Hypothyroidism- stable -Continue Synthroid -Check TSH  Throat cancer s/p trach- on blow by O2 in the ED -Trach care  All the records are reviewed and case discussed with ED provider. Management plans discussed with the patient, family and they are in agreement.  CODE STATUS: FULL  TOTAL TIME TAKING CARE OF THIS PATIENT: 50 minutes.    Berna Spare Mitchell Huffman M.D on 09/27/2018 at 10:14 AM  Between 7am to 6pm - Pager - [phone]  After 6pm go to [email or web address] - Proofreader  [facility]  Office  [phone]  CC: Primary care physician; Jodi Marble, MD   Note: This dictation was prepared with Dragon dictation along with smaller phrase technology. Any transcriptional errors that result from this process are unintentional.

## 2018-09-10 ENCOUNTER — Inpatient Hospital Stay: Payer: Medicare HMO

## 2018-09-10 ENCOUNTER — Inpatient Hospital Stay (HOSPITAL_COMMUNITY)
Admit: 2018-09-10 | Discharge: 2018-09-10 | Disposition: A | Discharge status: Home / Self Care | Payer: Medicare HMO | Attending: Internal Medicine | Admitting: Internal Medicine

## 2018-09-10 ENCOUNTER — Inpatient Hospital Stay: Payer: Self-pay

## 2018-09-10 DIAGNOSIS — I340 Nonrheumatic mitral (valve) insufficiency: Secondary | ICD-10-CM

## 2018-09-10 DIAGNOSIS — I483 Typical atrial flutter: Secondary | ICD-10-CM

## 2018-09-10 DIAGNOSIS — I5043 Acute on chronic combined systolic (congestive) and diastolic (congestive) heart failure: Secondary | ICD-10-CM

## 2018-09-10 LAB — BLOOD GAS, ARTERIAL
ACID-BASE DEFICIT: 10.2 mmol/L — AB (ref 0.0–2.0)
BICARBONATE: 14.6 mmol/L — AB (ref 20.0–28.0)
FIO2: 0.6
LHR: 16 {breaths}/min
MECHVT: 500 mL
O2 SAT: 91.3 %
PATIENT TEMPERATURE: 37
PCO2 ART: 29 mmHg — AB (ref 32.0–48.0)
PEEP/CPAP: 5 cmH2O
PH ART: 7.31 — AB (ref 7.350–7.450)
PO2 ART: 68 mmHg — AB (ref 83.0–108.0)

## 2018-09-10 LAB — CBC
HCT: 41.8 % (ref 39.0–52.0)
HEMOGLOBIN: 13.5 g/dL (ref 13.0–17.0)
MCH: 32.2 pg (ref 26.0–34.0)
MCHC: 32.3 g/dL (ref 30.0–36.0)
MCV: 99.8 fL (ref 80.0–100.0)
Platelets: 155 10*3/uL (ref 150–400)
RBC: 4.19 MIL/uL — AB (ref 4.22–5.81)
RDW: 12.6 % (ref 11.5–15.5)
WBC: 11.6 10*3/uL — AB (ref 4.0–10.5)
nRBC: 0 % (ref 0.0–0.2)

## 2018-09-10 LAB — HEMOGLOBIN A1C
Hgb A1c MFr Bld: 6.2 % — ABNORMAL HIGH (ref 4.8–5.6)
Mean Plasma Glucose: 131.24 mg/dL

## 2018-09-10 LAB — HEPARIN LEVEL (UNFRACTIONATED)
Heparin Unfractionated: 0.27 IU/mL — ABNORMAL LOW (ref 0.30–0.70)
Heparin Unfractionated: 0.33 IU/mL (ref 0.30–0.70)
Heparin Unfractionated: 0.24 IU/mL — ABNORMAL LOW (ref 0.30–0.70)

## 2018-09-10 LAB — LACTIC ACID, PLASMA: LACTIC ACID, VENOUS: 4.1 mmol/L — AB (ref 0.5–1.9)

## 2018-09-10 LAB — BASIC METABOLIC PANEL
ANION GAP: 15 (ref 5–15)
BUN: 30 mg/dL — ABNORMAL HIGH (ref 8–23)
CO2: 17 mmol/L — ABNORMAL LOW (ref 22–32)
Calcium: 7.8 mg/dL — ABNORMAL LOW (ref 8.9–10.3)
Chloride: 106 mmol/L (ref 98–111)
Creatinine, Ser: 2 mg/dL — ABNORMAL HIGH (ref 0.61–1.24)
GFR calc Af Amer: 35 mL/min — ABNORMAL LOW (ref >60)
GFR, EST NON AFRICAN AMERICAN: 30 mL/min — AB (ref >60)
Glucose, Bld: 159 mg/dL — ABNORMAL HIGH (ref 70–99)
POTASSIUM: 4.3 mmol/L (ref 3.5–5.1)
SODIUM: 138 mmol/L (ref 135–145)

## 2018-09-10 LAB — GLUCOSE, CAPILLARY
GLUCOSE-CAPILLARY: 161 mg/dL — AB (ref 70–99)
Glucose-Capillary: 178 mg/dL — ABNORMAL HIGH (ref 70–99)
Glucose-Capillary: 198 mg/dL — ABNORMAL HIGH (ref 70–99)
Glucose-Capillary: 158 mg/dL — ABNORMAL HIGH (ref 70–99)

## 2018-09-10 LAB — LIPID PANEL
CHOL/HDL RATIO: 1.7 ratio
CHOLESTEROL: 76 mg/dL (ref 0–200)
HDL: 44 mg/dL (ref >40)
LDL CALC: 19 mg/dL (ref 0–99)
TRIGLYCERIDES: 63 mg/dL (ref <150)
VLDL: 13 mg/dL (ref 0–40)

## 2018-09-10 LAB — MAGNESIUM: Magnesium: 2.3 mg/dL (ref 1.7–2.4)

## 2018-09-10 LAB — STREP PNEUMONIAE URINARY ANTIGEN: Strep Pneumo Urinary Antigen: NEGATIVE

## 2018-09-10 LAB — TROPONIN I: Troponin I: 27.07 ng/mL (ref <0.03)

## 2018-09-10 LAB — PROCALCITONIN: Procalcitonin: 20.63 ng/mL

## 2018-09-10 LAB — PHOSPHORUS: PHOSPHORUS: 5.4 mg/dL — AB (ref 2.5–4.6)

## 2018-09-10 MED ORDER — SODIUM CHLORIDE 0.9% FLUSH: 10.0000 mL | INTRAVENOUS | Status: DC | PRN

## 2018-09-10 MED ORDER — CHLORHEXIDINE GLUCONATE 0.12% ORAL RINSE (MEDLINE KIT)
15.0000 mL | Freq: Two times a day (BID) | OROMUCOSAL | Status: DC
  Administered 2018-09-10 – 2018-09-11 (×3): 15 mL via OROMUCOSAL

## 2018-09-10 MED ORDER — BISACODYL 10 MG RE SUPP: 10.0000 mg | Freq: Every day | RECTAL | Status: DC | PRN

## 2018-09-10 MED ORDER — CHLORHEXIDINE GLUCONATE 0.12 % MT SOLN: 15.0000 mL | Freq: Two times a day (BID) | OROMUCOSAL | Status: DC

## 2018-09-10 MED ORDER — DEXMEDETOMIDINE HCL IN NACL 400 MCG/100ML IV SOLN
0.4000 ug/kg/h | INTRAVENOUS | Status: DC
  Administered 2018-09-10 (×2): 0.4 ug/kg/h via INTRAVENOUS
  Administered 2018-09-11: 1.2 ug/kg/h via INTRAVENOUS
  Administered 2018-09-11: 0.8 ug/kg/h via INTRAVENOUS
  Filled 2018-09-10 (×3): qty 100

## 2018-09-10 MED ORDER — FENTANYL CITRATE (PF) 100 MCG/2ML IJ SOLN: 50.0000 ug | INTRAMUSCULAR | Status: DC | PRN

## 2018-09-10 MED ORDER — ORAL CARE MOUTH RINSE: 15.0000 mL | Freq: Two times a day (BID) | OROMUCOSAL | Status: DC

## 2018-09-10 MED ORDER — IPRATROPIUM-ALBUTEROL 0.5-2.5 (3) MG/3ML IN SOLN
3.0000 mL | Freq: Four times a day (QID) | RESPIRATORY_TRACT | Status: DC
  Administered 2018-09-10 – 2018-09-11 (×5): 3 mL via RESPIRATORY_TRACT
  Filled 2018-09-10 (×4): qty 3

## 2018-09-10 MED ORDER — HEPARIN BOLUS VIA INFUSION
1200.0000 [IU] | Freq: Once | INTRAVENOUS | Status: AC
  Administered 2018-09-10: 1200 [IU] via INTRAVENOUS
  Filled 2018-09-10: qty 1200

## 2018-09-10 MED ORDER — PHENYLEPHRINE HCL-NACL 10-0.9 MG/250ML-% IV SOLN
0.0000 ug/min | INTRAVENOUS | Status: DC
  Administered 2018-09-10 – 2018-09-11 (×2): 20 ug/min via INTRAVENOUS
  Administered 2018-09-11: 50 ug/min via INTRAVENOUS
  Filled 2018-09-10 (×3): qty 250

## 2018-09-10 MED ORDER — INSULIN ASPART 100 UNIT/ML ~~LOC~~ SOLN
0.0000 [IU] | SUBCUTANEOUS | Status: DC
  Administered 2018-09-10 (×4): 3 [IU] via SUBCUTANEOUS
  Administered 2018-09-11: 2 [IU] via SUBCUTANEOUS
  Filled 2018-09-10 (×5): qty 1

## 2018-09-10 MED ORDER — PANTOPRAZOLE SODIUM 40 MG IV SOLR
40.0000 mg | Freq: Every day | INTRAVENOUS | Status: AC
  Administered 2018-09-10 – 2018-09-11 (×2): 40 mg via INTRAVENOUS
  Filled 2018-09-10 (×2): qty 40

## 2018-09-10 MED ORDER — SODIUM CHLORIDE 0.9% FLUSH
10.0000 mL | Freq: Two times a day (BID) | INTRAVENOUS | Status: DC
  Administered 2018-09-10 – 2018-09-11 (×2): 10 mL

## 2018-09-10 MED ORDER — ORAL CARE MOUTH RINSE
15.0000 mL | OROMUCOSAL | Status: DC
  Administered 2018-09-10 – 2018-09-11 (×10): 15 mL via OROMUCOSAL

## 2018-09-10 MED ORDER — MIDAZOLAM HCL 2 MG/2ML IJ SOLN
1.0000 mg | INTRAMUSCULAR | Status: DC | PRN
  Administered 2018-09-10 (×2): 1 mg via INTRAVENOUS
  Filled 2018-09-10: qty 2

## 2018-09-10 MED ORDER — FUROSEMIDE 10 MG/ML IJ SOLN
40.0000 mg | Freq: Once | INTRAMUSCULAR | Status: AC
  Administered 2018-09-10: 40 mg via INTRAVENOUS
  Filled 2018-09-10: qty 4

## 2018-09-10 MED ORDER — MIDAZOLAM HCL 2 MG/2ML IJ SOLN: 1.0000 mg | INTRAMUSCULAR | Status: DC | PRN

## 2018-09-10 MED ORDER — SENNOSIDES 8.8 MG/5ML PO SYRP
5.0000 mL | ORAL_SOLUTION | Freq: Two times a day (BID) | ORAL | Status: DC | PRN
  Filled 2018-09-10: qty 5

## 2018-09-10 NOTE — Consult Note (Signed)
Mitchell Huffman, Mitchell Huffman [id number] 11-22-1940 Mitchell Nearing, MD  Reason for Consult: trach change, respiratory distress Requesting Physician: Fritzi Mandes, MD Consulting Physician: Mitchell Nearing, MD  HPI: This 77 y.o. year old male was admitted on 09/23/2018 for NSTEMI (non-ST elevated myocardial infarction) [facility]) [I21.4] Acute kidney injury ([place]) [N17.9] Sepsis, due to unspecified organism, unspecified whether acute organ dysfunction present ([place]) [A41.9]. 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  Medications:  Current Facility-Administered Medications  Medication Dose Route Frequency Provider Last Rate Last Dose  . acetaminophen (TYLENOL) tablet 650 mg  650 mg Oral Q4H PRN Mayo, Pete Pelt, MD      . amiodarone (NEXTERONE PREMIX) 360-4.14 MG/200ML-% (1.8 mg/mL) IV infusion  30 mg/hr Intravenous Continuous Allred, James, MD 16.67 mL/hr at 09/10/18 0600 30 mg/hr at 09/10/18 0600  . aspirin chewable tablet 324 mg  324 mg Oral NOW Mayo, Pete Pelt, MD       Or  . aspirin suppository 300 mg  300 mg Rectal NOW Mayo, Pete Pelt, MD      . aspirin EC tablet 81 mg  81 mg Oral QPM Mayo, Pete Pelt, MD      . bisacodyl (DULCOLAX) suppository 10 mg  10 mg Rectal Daily PRN Tukov-Yual, Magdalene S, NP      . ceFEPIme (MAXIPIME) 2 g in sodium chloride 0.9 % 100 mL IVPB  2 g Intravenous Q24H Cyndee Brightly Minkler, [facility]      . chlorhexidine gluconate (MEDLINE KIT) (PERIDEX) 0.12 % solution 15 mL  15 mL Mouth Rinse BID Tukov-Yual, Magdalene S, NP      . clopidogrel (PLAVIX) tablet 75 mg  75 mg Oral QPM Mayo, Pete Pelt, MD      . fentaNYL (SUBLIMAZE) injection 50 mcg  50 mcg Intravenous Q15 min PRN Tukov-Yual, Magdalene S, NP      . fentaNYL (SUBLIMAZE) injection 50 mcg  50 mcg Intravenous Q2H PRN Tukov-Yual, Magdalene S, NP      . heparin ADULT infusion 100 units/mL (25000 units/272m sodium chloride 0.45%)  1,000  Units/hr Intravenous Continuous Mayo, KPete Pelt MD 10 mL/hr at 09/10/18 0600 1,000 Units/hr at 09/10/18 0600  . insulin aspart (novoLOG) injection 0-15 Units  0-15 Units Subcutaneous Q4H Tukov-Yual, Magdalene S, NP      . ipratropium-albuterol (DUONEB) 0.5-2.5 (3) MG/3ML nebulizer solution 3 mL  3 mL Nebulization Q6H Tukov-Yual, Magdalene S, NP   3 mL at 09/10/18 0725  . lactated ringers infusion   Intravenous Continuous Tukov-Yual, Magdalene S, NP 50 mL/hr at 09/10/18 0600    . levothyroxine (SYNTHROID, LEVOTHROID) tablet 175 mcg  175 mcg Oral QAC breakfast Mayo, KPete Pelt MD      . MEDLINE mouth rinse  15 mL Mouth Rinse 10 times per day Tukov-Yual, Magdalene S, NP      . midazolam (VERSED) injection 1 mg  1 mg Intravenous Q15 min PRN Tukov-Yual, Magdalene S, NP      . midazolam (VERSED) injection 1 mg  1 mg Intravenous Q2H PRN Tukov-Yual, Magdalene S, NP      . nitroGLYCERIN (NITROSTAT) SL tablet 0.4 mg  0.4 mg Sublingual Q5 Min x 3 PRN Mayo, KPete Pelt MD      . ondansetron ([facility] injection 4 mg  4 mg Intravenous Q6H PRN Mayo, KPete Pelt MD      . pantoprazole (PROTONIX) injection 40 mg  40 mg Intravenous Daily Tukov-Yual,  Magdalene S, NP      . rosuvastatin (CRESTOR) tablet 40 mg  40 mg Oral QPM Mayo, Pete Pelt, MD      . sennosides (SENOKOT) 8.8 MG/5ML syrup 5 mL  5 mL Per Tube BID PRN Tukov-Yual, Magdalene S, NP      . vancomycin (VANCOCIN) IVPB 1000 mg/200 mL premix  1,000 mg Intravenous Q24H Paticia Stack, [place] at 09/30/2018 1736  .  Medications Prior to Admission  Medication Sig Dispense Refill  . aspirin EC 81 MG tablet Take 81 mg by mouth every evening.    . clopidogrel (PLAVIX) 75 MG tablet Take 75 mg by mouth every evening.     Marland Kitchen CRESTOR 40 MG tablet Take 1 tablet (40 mg total) by mouth every evening. 90 tablet 3  . levothyroxine (SYNTHROID, LEVOTHROID) 175 MCG tablet Take 175 mcg by mouth daily before breakfast.     . losartan (COZAAR) 50 MG tablet Take 50 mg by  mouth every evening.     . metFORMIN (GLUCOPHAGE) 500 MG tablet Take 500 mg by mouth daily.     . Multiple Vitamins-Minerals (MULTIVITAMIN WITH MINERALS) tablet Take 1 tablet by mouth every evening.     . digoxin (LANOXIN) 0.125 MG tablet Take 1 tablet (0.125 mg total) by mouth daily. (Patient not taking: Reported on 04/20/2016) 30 tablet 0  . metoprolol (LOPRESSOR) 100 MG tablet Take 1 tablet (100 mg total) by mouth 3 (three) times daily. (Patient taking differently: Take 100 mg by mouth daily. ) 90 tablet 0    Allergies: No Known Allergies  PMH:  Past Medical History:  Diagnosis Date  . Aortic stenosis    a. TTE 04/2016: estimated Valve area (VTI): 0.63 cm^2. Difficult to determine in setting of flutter and reduced EF, visually AS appeared to moderate to severe  . Atrial flutter ([place])    a. originally diagnosed 09/2015; b. not on full-dose anticoagulation coming into the hospital; c. CHADS2VASc at least 6 (HTN, age, DM, stroke x 2, vascular disease)  . CAD (coronary artery disease)    a. s/p 4 vessel CABG in 2007, no interventions since  . Carotid artery disease ([place])    a. s/p left-sided stenting in 1995  . Chronic back pain   . Chronic systolic CHF (congestive heart failure) ([place])    a. echo 04/2016: EF 30-35%, could not exclude RWMA, not tech sufficient to allow for LV dia fxn, severe AS, mild bilateral enlargement, RV mildly dilated with mildly reduced sys fxn  . Diabetes mellitus ([place])   . Hyperlipidemia   . Hypertension   . Stroke [facility])    a. x 2 in 1995  . 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 s/p tracheostmy and XRT in 2007    Fam Hx:  Family History  Problem Relation Age of Onset  . Hypertension Father   . Hyperlipidemia Father     Soc Hx:  Social History   Socioeconomic History  . Marital status: Married    Spouse name: Not on file  . Number of children: Not on file  . Years of education: Not on file  . Highest education  level: Not on file  Occupational History  . Not on file  Social Needs  . Financial resource strain: Not on file  . Food insecurity:    Worry: Not on file    Inability: Not on file  . Transportation needs:  Medical: Not on file    Non-medical: Not on file  Tobacco Use  . Smoking status: Former Smoker    Packs/day: 3.00    Years: 135.00    Pack years: 405.00    Types: Cigarettes    Start date: 11/01/1948    Last attempt to quit: 11/01/1993    Years since quitting: 24.8  Substance and Sexual Activity  . Alcohol use: Yes    Alcohol/week: 4.0 standard drinks    Types: 4 Standard drinks or equivalent per week  . Drug use: No  . Sexual activity: Not on file  Lifestyle  . Physical activity:    Days per week: Not on file    Minutes per session: Not on file  . Stress: Not on file  Relationships  . Social connections:    Talks on phone: Not on file    Gets together: Not on file    Attends religious service: Not on file    Active member of club or organization: Not on file    Attends meetings of clubs or organizations: Not on file    Relationship status: Not on file  . Intimate partner violence:    Fear of current or ex partner: Not on file    Emotionally abused: Not on file    Physically abused: Not on file    Forced sexual activity: Not on file  Other Topics Concern  . Not on file  Social History Narrative  . Not on file    PSH:  Past Surgical History:  Procedure Laterality Date  . BYPASS GRAFT    . CARDIAC CATHETERIZATION    . ELECTROPHYSIOLOGIC STUDY N/A 04/23/2016   Procedure: CARDIOVERSION;  Surgeon: Dionisio David, MD;  Location: [facility] ORS;  Service: Cardiovascular;  Laterality: N/A;  . TRACHEOSTOMY    . Procedures since admission: No admission procedures for hospital encounter.  ROS: Review of systems normal other than 12 systems except per HPI.  PHYSICAL EXAM  Vitals: Blood pressure 126/77, pulse (!) 126, temperature 100 F (37.8 C), temperature source Oral,  resp. rate (!) 34, height '5\' 6"'$  (1.676 m), weight 76.6 kg, SpO2 92 %.. General: Thin elderly male, coughing with mild respiratory distress Mood: Mood and affect well adjusted, pleasant and cooperative. Orientation: Cooperates with simple commands Vocal Quality: Aphonic due to laryngectomy head and Face: NCAT. No facial asymmetry. No visible skin lesions. No significant facial scars. No tenderness with sinus percussion. Facial strength normal and symmetric. Ears: External ears with normal landmarks, no lesions. External auditory canals free of infection, cerumen impaction or lesions. Tympanic membranes intact with good landmarks and normal mobility on pneumatic otoscopy. No middle ear effusion. Hearing: Speech reception grossly normal. Nose: External nose normal with midline dorsum and no lesions or deformity. Nasal Cavity reveals essentially midline septum with normal inferior turbinates. No significant mucosal congestion or erythema. Nasal secretions are minimal and clear. No polyps seen on anterior rhinoscopy. Oral Cavity/ Oropharynx: Lips are normal with no lesions. Teeth partially edentulous. Gingiva healthy with no lesions or gingivitis. Oropharynx including tongue, buccal mucosa, floor of mouth, hard and soft palate, uvula and posterior pharynx free of exudates, erythema or lesions with normal symmetry and hydration.  Indirect Laryngoscopy/Nasopharyngoscopy: Visualization of the larynx, hypopharynx and nasopharynx is not possible in this setting with routine examination. Neck: Supple and symmetric with no palpable masses, tenderness or crepitance. There is a #6 Shiley cuffless trach in place. This was removed and he is noted to have a post-laryngectomy stoma  with no granulation tissue, purulent sputum. A #6 cuffed trach was easily placed. Parotid and submandibular glands are soft, nontender and symmetric, without masses. Lymphatic: Cervical lymph nodes are without palpable lymphadenopathy or  tenderness. Respiratory: Normal respiratory effort without labored breathing. Cardiovascular: Carotid pulse shows regular rate and rhythm Neurologic: Cranial Nerves II through XII are grossly intact. Eyes: Gaze and Ocular Motility are grossly normal. PERRLA. No visible nystagmus.  MEDICAL DECISION MAKING: Data Review:  Results for orders placed or performed during the hospital encounter of 09/16/2018 (from the past 48 hour(s))  Troponin I Once     Status: Abnormal   Collection Time: 09/30/2018  8:21 AM  Result Value Ref Range   Troponin I 8.03 (HH) <0.03 ng/mL    Comment: CRITICAL RESULT CALLED TO, READ BACK BY AND VERIFIED WITH HUNTER ORE AT 0902 ON 09/14/2018 BY SNJ Performed at [facility], [address] [place], [place] [address]   Comprehensive metabolic panel     Status: Abnormal   Collection Time: 09/15/2018  8:21 AM  Result Value Ref Range   Sodium 137 135 - 145 mmol/L   Potassium 4.1 3.5 - 5.1 mmol/L   Chloride 98 98 - 111 mmol/L   CO2 23 22 - 32 mmol/L   Glucose, Bld 222 (H) 70 - 99 mg/dL   BUN 22 8 - 23 mg/dL   Creatinine, Ser 2.14 (H) 0.61 - 1.24 mg/dL   Calcium 9.2 8.9 - 10.3 mg/dL   Total Protein 8.2 (H) 6.5 - 8.1 g/dL   Albumin 4.0 3.5 - 5.0 g/dL   AST 140 (H) 15 - 41 U/L   ALT 40 0 - 44 U/L   Alkaline Phosphatase 70 38 - 126 U/L   Total Bilirubin 0.8 0.3 - 1.2 mg/dL   GFR calc non Af Amer 28 (L) >60 mL/min   GFR calc Af Amer 33 (L) >60 mL/min    Comment: (NOTE) The eGFR has been calculated using the CKD EPI equation. This calculation has not been validated in all clinical situations. eGFR's persistently <60 mL/min signify possible Chronic Kidney Disease.    Anion gap 16 (H) 5 - 15    Comment: Performed at [facility], [address] [place], [place] [address]  CBC with Differential     Status: Abnormal   Collection Time: 09/24/2018  8:21 AM  Result Value Ref Range   WBC 12.6 (H) 4.0 - 10.5 K/uL   RBC 4.25 4.22 - 5.81 MIL/uL   Hemoglobin  13.9 13.0 - 17.0 g/dL   HCT 40.7 39.0 - 52.0 %   MCV 95.8 80.0 - 100.0 fL   MCH 32.7 26.0 - 34.0 pg   MCHC 34.2 30.0 - 36.0 g/dL   RDW 12.3 11.5 - 15.5 %   Platelets 172 150 - 400 K/uL   nRBC 0.0 0.0 - 0.2 %   Neutrophils Relative % 89 %   Neutro Abs 11.3 (H) 1.7 - 7.7 K/uL   Lymphocytes Relative 3 %   Lymphs Abs 0.3 (L) 0.7 - 4.0 K/uL   Monocytes Relative 6 %   Monocytes Absolute 0.8 0.1 - 1.0 K/uL   Eosinophils Relative 1 %   Eosinophils Absolute 0.1 0.0 - 0.5 K/uL   Basophils Relative 0 %   Basophils Absolute 0.0 0.0 - 0.1 K/uL   WBC Morphology TOXIC GRANULATION    RBC Morphology MORPHOLOGY UNREMARKABLE    Smear Review MORPHOLOGY UNREMARKABLE    Immature Granulocytes 1 %   Abs Immature  Granulocytes 0.08 (H) 0.00 - 0.07 K/uL    Comment: Performed at [facility], [address] [place], [place] [address]  Digoxin level     Status: Abnormal   Collection Time: 09/05/2018  8:21 AM  Result Value Ref Range   Digoxin Level <0.2 (L) 0.8 - 2.0 ng/mL    Comment: RESULTS CHECKED SNJ Performed at [facility], [address] [place], [place] [address]   Lactic acid, plasma     Status: Abnormal   Collection Time: 09/01/2018  8:21 AM  Result Value Ref Range   Lactic Acid, Venous 6.2 (HH) 0.5 - 1.9 mmol/L    Comment: CRITICAL RESULT CALLED TO, READ BACK BY AND VERIFIED WITH  HUNTER ORE AT 0856 ON 09/04/2018 BY SNJ Performed at [facility], [address] [place], [place] [address]   APTT     Status: None   Collection Time: 09/30/2018  8:21 AM  Result Value Ref Range   aPTT 33 24 - 36 seconds    Comment: Performed at [facility], [address] [place], [place] [address]  Protime-INR     Status: Abnormal   Collection Time: 09/05/2018  8:21 AM  Result Value Ref Range   Prothrombin Time 15.6 (H) 11.4 - 15.2 seconds   INR 1.25     Comment: Performed at [facility] [address], [place], [place] [address]  Culture, blood (routine x 2)      Status: None (Preliminary result)   Collection Time: 09/21/2018  8:35 AM  Result Value Ref Range   Specimen Description BLOOD R AC    Special Requests      BOTTLES DRAWN AEROBIC AND ANAEROBIC Blood Culture results may not be optimal due to an excessive volume of blood received in culture bottles   Culture      NO GROWTH < 24 HOURS Performed at [facility], [address], [place], [place] [address]    Report Status PENDING   Culture, blood (routine x 2)     Status: None (Preliminary result)   Collection Time: 09/29/2018  8:47 AM  Result Value Ref Range   Specimen Description BLOOD  LAC    Special Requests      BOTTLES DRAWN AEROBIC AND ANAEROBIC Blood Culture results may not be optimal due to an excessive volume of blood received in culture bottles   Culture      NO GROWTH < 24 HOURS Performed at [facility], [address], [place], [place] [address]    Report Status PENDING   Lactic acid, plasma     Status: Abnormal   Collection Time: 09/01/2018 11:15 AM  Result Value Ref Range   Lactic Acid, Venous 2.9 (HH) 0.5 - 1.9 mmol/L    Comment: CRITICAL RESULT CALLED TO, READ BACK BY AND VERIFIED WITH HUNTER ORE AT 1153 ON 09/03/2018 BY SNJ Performed at [facility], [address] [place],  [address]   Urinalysis, Complete w Microscopic     Status: Abnormal   Collection Time: 09/01/2018 11:37 AM  Result Value Ref Range   Color, Urine AMBER (A) YELLOW    Comment: BIOCHEMICALS MAY BE AFFECTED BY COLOR   APPearance HAZY (A) CLEAR   Specific Gravity, Urine 1.015 1.005 - 1.030   pH 5.0 5.0 - 8.0   Glucose, UA >=500 (A) NEGATIVE mg/dL   Hgb urine dipstick MODERATE (A) NEGATIVE   Bilirubin Urine NEGATIVE NEGATIVE   Ketones, ur NEGATIVE NEGATIVE mg/dL  Protein, ur 100 (A) NEGATIVE mg/dL   Nitrite NEGATIVE NEGATIVE   Leukocytes, UA NEGATIVE NEGATIVE   RBC / HPF 0-5 0 - 5 RBC/hpf   WBC, UA 0-5 0 - 5 WBC/hpf   Bacteria, UA NONE SEEN NONE SEEN    Squamous Epithelial / LPF 0-5 0 - 5    Comment: Performed at [facility], [address] [place], [place] [address]  Glucose, capillary     Status: Abnormal   Collection Time: 09/23/2018 12:33 PM  Result Value Ref Range   Glucose-Capillary 174 (H) 70 - 99 mg/dL  MRSA PCR Screening     Status: None   Collection Time: 09/16/2018 12:38 PM  Result Value Ref Range   MRSA by PCR NEGATIVE NEGATIVE    Comment:        The GeneXpert MRSA Assay (FDA approved for NASAL specimens only), is one component of a comprehensive MRSA colonization surveillance program. It is not intended to diagnose MRSA infection nor to guide or monitor treatment for MRSA infections. Performed at [facility], [address] [place], [place] [address]   TSH     Status: Abnormal   Collection Time: 09/25/2018 12:52 PM  Result Value Ref Range   TSH 0.076 (L) 0.350 - 4.500 uIU/mL    Comment: Performed by a 3rd Generation assay with a functional sensitivity of <=0.01 uIU/mL. Performed at [facility], [address] [place], [place] [address]   Troponin I Now Then Q6H     Status: Abnormal   Collection Time: 09/08/2018 12:52 PM  Result Value Ref Range   Troponin I 23.15 (HH) <0.03 ng/mL    Comment: CRITICAL VALUE NOTED. VALUE IS CONSISTENT WITH PREVIOUSLY REPORTED/CALLED VALUE SNJ Performed at [facility], [address] [place], [place] [address]   Brain natriuretic peptide     Status: Abnormal   Collection Time: 09/25/2018 12:52 PM  Result Value Ref Range   B Natriuretic Peptide 1,716.0 (H) 0.0 - 100.0 pg/mL    Comment: Performed at [facility], [address] [place], [place] [address]  Procalcitonin - Baseline     Status: None   Collection Time: 09/03/2018 12:52 PM  Result Value Ref Range   Procalcitonin 26.49 ng/mL    Comment:        Interpretation: PCT >= 10 ng/mL: Important systemic inflammatory response, almost exclusively due to severe bacterial sepsis or  septic shock. (NOTE)       Sepsis PCT Algorithm           Lower Respiratory Tract                                      Infection PCT Algorithm    ----------------------------     ----------------------------         PCT < 0.25 ng/mL                PCT < 0.10 ng/mL         Strongly encourage             Strongly discourage   discontinuation of antibiotics    initiation of antibiotics    ----------------------------     -----------------------------       PCT 0.25 - 0.50 ng/mL            PCT 0.10 - 0.25 ng/mL  OR       >80% decrease in PCT            Discourage initiation of                                            antibiotics      Encourage discontinuation           of antibiotics    ----------------------------     -----------------------------         PCT >= 0.50 ng/mL              PCT 0.26 - 0.50 ng/mL                AND       <80% decrease in PCT             Encourage initiation of                                             antibiotics       Encourage continuation           of antibiotics    ----------------------------     -----------------------------        PCT >= 0.50 ng/mL                  PCT > 0.50 ng/mL               AND         increase in PCT                  Strongly encourage                                      initiation of antibiotics    Strongly encourage escalation           of antibiotics                                     -----------------------------                                           PCT <= 0.25 ng/mL                                                 OR                                        > 80% decrease in PCT                                     Discontinue / Do not initiate  antibiotics Performed at [facility], [address] [place], [place] [address]   Culture, respiratory (non-expectorated)     Status: None (Preliminary result)   Collection Time: 09/26/2018 12:56 PM   Result Value Ref Range   Specimen Description      TRACHEAL ASPIRATE Performed at [facility], [address], [place], [place] [address]    Special Requests      Normal Performed at [facility], [place], [place] [address]    Gram Stain      RARE WBC PRESENT, PREDOMINANTLY PMN FEW GRAM POSITIVE RODS RARE GRAM POSITIVE COCCI Performed at [facility], [place], [place], [place] [address]    Culture PENDING    Report Status PENDING   Glucose, capillary     Status: Abnormal   Collection Time: 09/23/2018  5:15 PM  Result Value Ref Range   Glucose-Capillary 163 (H) 70 - 99 mg/dL  Heparin level (unfractionated)     Status: None   Collection Time: 09/02/2018  7:05 PM  Result Value Ref Range   Heparin Unfractionated 0.42 0.30 - 0.70 IU/mL    Comment: (NOTE) If heparin results are below expected values, and patient dosage has  been confirmed, suggest follow up testing of antithrombin III levels. Performed at [facility], [address] [place], [place] [address]   Troponin I Now Then Q6H     Status: Abnormal   Collection Time: 09/27/2018  7:05 PM  Result Value Ref Range   Troponin I 23.42 (HH) <0.03 ng/mL    Comment: CRITICAL VALUE NOTED. VALUE IS CONSISTENT WITH PREVIOUSLY REPORTED/CALLED VALUEMSS Performed at [facility], [address] [place], [place] [address]   Magnesium     Status: None   Collection Time: 09/17/2018  9:22 PM  Result Value Ref Range   Magnesium 1.7 1.7 - 2.4 mg/dL    Comment: Performed at [facility], [address] [place], [place] [address]  Phosphorus     Status: None   Collection Time: 09/20/2018  9:22 PM  Result Value Ref Range   Phosphorus 3.7 2.5 - 4.6 mg/dL    Comment: Performed at [facility], [address] [place], [place] [address]  Basic metabolic panel     Status: Abnormal   Collection Time: 09/28/2018  9:22 PM  Result Value Ref Range   Sodium 136 135  - 145 mmol/L   Potassium 4.2 3.5 - 5.1 mmol/L   Chloride 108 98 - 111 mmol/L   CO2 17 (L) 22 - 32 mmol/L   Glucose, Bld 138 (H) 70 - 99 mg/dL   BUN 26 (H) 8 - 23 mg/dL   Creatinine, Ser 1.71 (H) 0.61 - 1.24 mg/dL   Calcium 7.8 (L) 8.9 - 10.3 mg/dL   GFR calc non Af Amer 37 (L) >60 mL/min   GFR calc Af Amer 43 (L) >60 mL/min    Comment: (NOTE) The eGFR has been calculated using the CKD EPI equation. This calculation has not been validated in all clinical situations. eGFR's persistently <60 mL/min signify possible Chronic Kidney Disease.    Anion gap 11 5 - 15    Comment: Performed at [facility], [address] [place], [place] [address]  Glucose, capillary     Status: Abnormal   Collection Time: 09/13/2018  9:46 PM  Result Value Ref Range   Glucose-Capillary 140 (H) 70 - 99 mg/dL  Troponin I Now Then Q6H  Status: Abnormal   Collection Time: 09/10/18 12:38 AM  Result Value Ref Range   Troponin I 27.07 (HH) <0.03 ng/mL    Comment: CRITICAL VALUE NOTED. VALUE IS CONSISTENT WITH PREVIOUSLY REPORTED/CALLED VALUE RWW Performed at [facility], [address] [place], [place] [address]   Basic metabolic panel     Status: Abnormal   Collection Time: 09/10/18  3:40 AM  Result Value Ref Range   Sodium 138 135 - 145 mmol/L   Potassium 4.3 3.5 - 5.1 mmol/L   Chloride 106 98 - 111 mmol/L   CO2 17 (L) 22 - 32 mmol/L   Glucose, Bld 159 (H) 70 - 99 mg/dL   BUN 30 (H) 8 - 23 mg/dL   Creatinine, Ser 2.00 (H) 0.61 - 1.24 mg/dL   Calcium 7.8 (L) 8.9 - 10.3 mg/dL   GFR calc non Af Amer 30 (L) >60 mL/min   GFR calc Af Amer 35 (L) >60 mL/min    Comment: (NOTE) The eGFR has been calculated using the CKD EPI equation. This calculation has not been validated in all clinical situations. eGFR's persistently <60 mL/min signify possible Chronic Kidney Disease.    Anion gap 15 5 - 15    Comment: Performed at [facility], [address] [place], [place] [address]   CBC     Status: Abnormal   Collection Time: 09/10/18  3:40 AM  Result Value Ref Range   WBC 11.6 (H) 4.0 - 10.5 K/uL   RBC 4.19 (L) 4.22 - 5.81 MIL/uL   Hemoglobin 13.5 13.0 - 17.0 g/dL   HCT 41.8 39.0 - 52.0 %   MCV 99.8 80.0 - 100.0 fL   MCH 32.2 26.0 - 34.0 pg   MCHC 32.3 30.0 - 36.0 g/dL   RDW 12.6 11.5 - 15.5 %   Platelets 155 150 - 400 K/uL   nRBC 0.0 0.0 - 0.2 %    Comment: Performed at [facility], [address] [place],  [address]  Lipid panel     Status: None   Collection Time: 09/10/18  3:40 AM  Result Value Ref Range   Cholesterol 76 0 - 200 mg/dL   Triglycerides 63 <150 mg/dL   HDL 44 >40 mg/dL   Total CHOL/HDL Ratio 1.7 RATIO   VLDL 13 0 - 40 mg/dL   LDL Cholesterol 19 0 - 99 mg/dL    Comment:        Total Cholesterol/HDL:CHD Risk Coronary Heart Disease Risk Table                     Men   Women  1/2 Average Risk   3.4   3.3  Average Risk       5.0   4.4  2 X Average Risk   9.6   7.1  3 X Average Risk  23.4   11.0        Use the calculated Patient Ratio above and the CHD Risk Table to determine the patient's CHD Risk.        ATP III CLASSIFICATION (LDL):  <100     mg/dL   Optimal  100-129  mg/dL   Near or Above                    Optimal  130-159  mg/dL   Borderline  160-189  mg/dL   High  >190     mg/dL   Very High Performed at [facility]  Lab, [place], [place] [address]   Procalcitonin     Status: None   Collection Time: 09/10/18  3:40 AM  Result Value Ref Range   Procalcitonin 20.63 ng/mL    Comment:        Interpretation: PCT >= 10 ng/mL: Important systemic inflammatory response, almost exclusively due to severe bacterial sepsis or septic shock. (NOTE)       Sepsis PCT Algorithm           Lower Respiratory Tract                                      Infection PCT Algorithm    ----------------------------     ----------------------------         PCT < 0.25 ng/mL                PCT < 0.10 ng/mL          Strongly encourage             Strongly discourage   discontinuation of antibiotics    initiation of antibiotics    ----------------------------     -----------------------------       PCT 0.25 - 0.50 ng/mL            PCT 0.10 - 0.25 ng/mL               OR       >80% decrease in PCT            Discourage initiation of                                            antibiotics      Encourage discontinuation           of antibiotics    ----------------------------     -----------------------------         PCT >= 0.50 ng/mL              PCT 0.26 - 0.50 ng/mL                AND       <80% decrease in PCT             Encourage initiation of                                             antibiotics       Encourage continuation           of antibiotics    ----------------------------     -----------------------------        PCT >= 0.50 ng/mL                  PCT > 0.50 ng/mL               AND         increase in PCT                  Strongly encourage  initiation of antibiotics    Strongly encourage escalation           of antibiotics                                     -----------------------------                                           PCT <= 0.25 ng/mL                                                 OR                                        > 80% decrease in PCT                                     Discontinue / Do not initiate                                             antibiotics Performed at [facility], [address] [place], [place] [address]   Heparin level (unfractionated)     Status: Abnormal   Collection Time: 09/10/18  3:40 AM  Result Value Ref Range   Heparin Unfractionated 0.27 (L) 0.30 - 0.70 IU/mL    Comment: (NOTE) If heparin results are below expected values, and patient dosage has  been confirmed, suggest follow up testing of antithrombin III levels. Performed at [facility], [address]  [place], [place] [address]   Magnesium     Status: None   Collection Time: 09/10/18  3:43 AM  Result Value Ref Range   Magnesium 2.3 1.7 - 2.4 mg/dL    Comment: Performed at [facility], [address] [place], [place] [address]  Phosphorus     Status: Abnormal   Collection Time: 09/10/18  3:43 AM  Result Value Ref Range   Phosphorus 5.4 (H) 2.5 - 4.6 mg/dL    Comment: Performed at [facility], [address] [place], [place] [address]  Glucose, capillary     Status: Abnormal   Collection Time: 09/10/18  7:32 AM  Result Value Ref Range   Glucose-Capillary 161 (H) 70 - 99 mg/dL  . Dg Chest 2 View  Result Date: 09/08/2018 CLINICAL DATA:  Pt presents via EMS c/o SOB and falls x2. Pt has trach however removed by pt per EMS report. Green mucus noted on exam from trach. Pt has small abrasion noted to back of head s/p fall. Pt unable to talk at this time due to trach. Hx - Aortic stenosis, atrial flutter, CAD w/ CABG, CHF, diabetes, HTN, throat cancer, former smoker quit 1995, smoked 3 ppd. EXAM: CHEST - 2 VIEW COMPARISON:  5/30/7 FINDINGS: Stable changes from prior CABG surgery. Cardiac silhouette is mildly enlarged. No mediastinal or hilar masses. No convincing  adenopathy. Lungs are clear.  No pleural effusion or pneumothorax. Skeletal structures are grossly intact. IMPRESSION: 1. No acute cardiopulmonary disease. 2. Cardiomegaly. Electronically Signed   By: Lajean Manes M.D.   On: 09/10/2018 08:43   Ct Head Wo Contrast  Result Date: 09/01/2018 CLINICAL DATA:  Falling over the last 2 days with trauma to the head. Shortness of breath. EXAM: CT HEAD WITHOUT CONTRAST TECHNIQUE: Contiguous axial images were obtained from the base of the skull through the vertex without intravenous contrast. COMPARISON:  06/28/2012 FINDINGS: Brain: Generalized atrophy. Chronic small-vessel ischemic changes of the thalami, basal ganglia and cerebral hemispheric white matter. No cortical or large vessel  territory infarction. No evidence of mass lesion, hemorrhage, hydrocephalus or extra-axial collection. Vascular: There is atherosclerotic calcification of the major vessels at the base of the brain. Skull: Negative Sinuses/Orbits: Clear/normal Other: None IMPRESSION: No acute or traumatic finding. Atrophy and chronic small-vessel ischemic changes throughout the brain. Electronically Signed   By: Nelson Chimes M.D.   On: 09/25/2018 10:39  .   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 A #6 cuffed Shiley trach was placed to facilitate vent support  PLAN: Will sign off. When he is off the vent, respiratory therapy can easily replace the cuffed trach with a #6 Shiley uncuffed trach given the straightforward nature of this in a stable tracheal stoma.   Mitchell Nearing, MD 09/10/2018 7:39 AM

## 2018-09-10 NOTE — Progress Notes (Signed)
ANTICOAGULATION CONSULT NOTE - Initial Consult  Pharmacy Consult for heparin Indication: chest pain/ACS  No Known Allergies  Patient Measurements: Height: 5\' 6"  (167.6 cm) Weight: 168 lb 14 oz (76.6 kg) IBW/kg (Calculated) : 63.8 Heparin Dosing Weight: 72.6 kg  Vital Signs: Temp: 98.3 F (36.8 C) (11/10 1530) Temp Source: Axillary (11/10 1530) BP: 98/83 (11/10 1500) Pulse Rate: 131 (11/10 1500)  Labs: Recent Labs    09/16/2018 0821 09/08/2018 1252 09/16/2018 1905 09/24/2018 2122 09/10/18 0038 09/10/18 0340 09/10/18 1258  HGB 13.9  --   --   --   --  13.5  --   HCT 40.7  --   --   --   --  41.8  --   PLT 172  --   --   --   --  155  --   APTT 33  --   --   --   --   --   --   LABPROT 15.6*  --   --   --   --   --   --   INR 1.25  --   --   --   --   --   --   HEPARINUNFRC  --   --  0.42  --   --  0.27* 0.33  CREATININE 2.14*  --   --  1.71*  --  2.00*  --   TROPONINI 8.03* 23.15* 23.42*  --  27.07*  --   --     Estimated Creatinine Clearance: 30.1 mL/min (A) (by C-G formula based on SCr of 2 mg/dL (H)).    Assessment: 77 yo male here with atrial flutter w/RVR and multiple organ failure. Pt does not appear to be on oral anticoagulant per PTA med list. Hgb stable at this time  Heparin Course: 11/09 AM initiation 4000 unit bolus, then 850 units/hr 11/09 1905 HL 0.42 11/10 0340 HL 0.27: bolus with 1200 units, increase rate to 1000 units/hr 11/10 1258 HL 0.33 11/10 2007 HL 0.24  Goal of Therapy:  Heparin level 0.3-0.7 units/ml Monitor platelets by anticoagulation protocol: Yes   Plan:  Re-bolus with 1200 units, then increase rate to 1150 units/hr Heparin level in 8h  CBC in AM  Pharmacy will continue to follow.   Dallie Piles, PharmD 09/10/2018,5:10 PM

## 2018-09-10 NOTE — Progress Notes (Signed)
Patient ID: Mitchell Huffman, male   DOB: 02/04/1941, 77 y.o.   MRN: [id number] Pulmonary/critical care attending  Patient with decreasing blood pressure, may require pressors, it is safe to place PICC line with creatinine of 2  Hermelinda Dellen, DO

## 2018-09-10 NOTE — Progress Notes (Signed)
Progress Note   Subjective   Very ill.  Now on ventilator.  Requiring precedex.  Marked elevation of cardiac markers but without chest pain.  Inpatient Medications    Scheduled Meds: . aspirin EC  81 mg Oral QPM  . chlorhexidine gluconate (MEDLINE KIT)  15 mL Mouth Rinse BID  . clopidogrel  75 mg Oral QPM  . insulin aspart  0-15 Units Subcutaneous Q4H  . ipratropium-albuterol  3 mL Nebulization Q6H  . levothyroxine  175 mcg Oral QAC breakfast  . mouth rinse  15 mL Mouth Rinse 10 times per day  . pantoprazole (PROTONIX) IV  40 mg Intravenous Daily  . rosuvastatin  40 mg Oral QPM   Continuous Infusions: . amiodarone 30 mg/hr (09/10/18 0940)  . ceFEPime (MAXIPIME) IV 2 g (09/10/18 0921)  . dexmedetomidine (PRECEDEX) IV infusion 0.6 mcg/kg/hr (09/10/18 1455)  . heparin 1,000 Units/hr (09/10/18 0911)  . lactated ringers 50 mL/hr at 09/10/18 1313  . vancomycin Stopped (09/10/2018 1736)   PRN Meds: acetaminophen, bisacodyl, fentaNYL (SUBLIMAZE) injection, fentaNYL (SUBLIMAZE) injection, midazolam, midazolam, nitroGLYCERIN, ondansetron (ZOFRAN) IV, sennosides   Vital Signs    Vitals:   09/10/18 1300 09/10/18 1346 09/10/18 1400 09/10/18 1500  BP: (!) 81/66  90/66 98/83  Pulse: (!) 127 (!) 128 (!) 129 (!) 131  Resp: (!) 34 (!) 31 (!) 36 (!) 35  Temp:      TempSrc:      SpO2: 92% 94%    Weight:      Height:        Intake/Output Summary (Last 24 hours) at 09/10/2018 1510 Last data filed at 09/10/2018 0700 Gross per 24 hour  Intake 2534.69 ml  Output 230 ml  Net 2304.69 ml   Filed Weights   09/24/2018 0931 09/19/2018 1233  Weight: 72.6 kg 76.6 kg    Telemetry    Atrial flutter with 2:1 conduction - Personally Reviewed  Physical Exam   GEN- The patient is very ill appearing, sleeping but rouses Head- normocephalic, atraumatic Eyes-  Sclera clear, conjunctiva pink Ears- hearing intact Oropharynx- clear Neck- supple,  Trach, now back on vent Lungs- Coarse  BS Heart-tachycardic irregular rhythm  GI- soft, NT, ND, + BS Extremities- no clubbing, cyanosis, or edema  MS- diffuse atrophy Skin- diffuse ecchymosis Psych- unable to assess Neuro- unable to assess   Labs    Chemistry Recent Labs  Lab 09/17/2018 0821 09/27/2018 2122 09/10/18 0340  NA 137 136 138  K 4.1 4.2 4.3  CL 98 108 106  CO2 23 17* 17*  GLUCOSE 222* 138* 159*  BUN 22 26* 30*  CREATININE 2.14* 1.71* 2.00*  CALCIUM 9.2 7.8* 7.8*  PROT 8.2*  --   --   ALBUMIN 4.0  --   --   AST 140*  --   --   ALT 40  --   --   ALKPHOS 70  --   --   BILITOT 0.8  --   --   GFRNONAA 28* 37* 30*  GFRAA 33* 43* 35*  ANIONGAP 16* 11 15     Hematology Recent Labs  Lab 09/20/2018 0821 09/10/18 0340  WBC 12.6* 11.6*  RBC 4.25 4.19*  HGB 13.9 13.5  HCT 40.7 41.8  MCV 95.8 99.8  MCH 32.7 32.2  MCHC 34.2 32.3  RDW 12.3 12.6  PLT 172 155    Cardiac Enzymes Recent Labs  Lab 09/04/2018 0821 09/26/2018 1252 09/23/2018 1905 09/10/18 0038  TROPONINI 8.03* 23.15* 23.42* 27.07*  No results for input(s): TROPIPOC in the last 168 hours.      Assessment & Plan    1.  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  He has chronic systolic dysfunction, aortic stenosis, and A Flutter with RVR.  He has known CAD and is s/p prior CABG.  Acute thrombotic MI is less likely.   Continue ASA, plavix, and IV heparin.  Additional medical therapy is limited by hypotension. Not currently a candidate for further CV testing Echo is pending.  2. Atrial flutter (Typical) with RVR On IV heparin On amiodarone for rate control Consider TEE guided cardioversion if he becomes more stable. If he becomes more hemodynamically unstable, bedside urgent cardioversion without TEE would be necessary but with increased stroke risks.  3. Aortic stenosis Echo pending Maintain adequate preload  4. Sepsis/ hypotension/  acute respiratory failure/ pneumonia Per primary team multifactorial in origin  Critical care time was exclusive of separate billable procedures and treating other patients.  Critial care time was spent personally by me on the following activities: development of treatment plan with  nursing,  evaluation of patients response to treatment, examining patient,  ordering/ reviewing treatments/ interventions, lab studies, radiographic studies, pulse ox, and re-evaluation of patients condition.     The patient is critically ill with multiple organ systems failure and requires high complexity decision making for assessment and support, frequent evaluation and titration of therapies, application of advanced monitoring technologies and extensive interpretation of databases.   Critical care was necessary to treat or prevent immintent or life-threatening deterioration.  Total CCT spent directly with the patient today is [place] MD, [facility] 09/10/2018 3:16 PM

## 2018-09-10 NOTE — Progress Notes (Signed)
Patient found with oxygen down to 70% on HFTC at 50 liters at 100%. Trach collar had become disconnected. Paient oxygen improved with reconnection. RT and NP in room at patients bedside. NP reduced IVF from 121ml to 66ml and gave verbal order for lasix 40mg  IV once now. Patient urine output at 15ml for this shift. Pt continues to be tachypnic and tachycardic but appears to be in no distress at present. Oxygen saturation is 95% on HFTC at 50 liters at 100%.

## 2018-09-10 NOTE — Progress Notes (Signed)
Spoke with Child psychotherapist.  Confirms PICC line is in.  Recommended to remove any unneccesary PIV since PICC is in place and routine line care has been entered.

## 2018-09-10 NOTE — Progress Notes (Addendum)
Follow up - Critical Care Medicine Note  Patient Details:    Mitchell Huffman is an 77 y.o. male. with a past medical history remarkable for hypertension, hyperlipidemia, diabetes mellitus, CVA x2, throat cancer, status post tracheostomy, coronary artery disease, status post four-vessel bypass, carotid artery disease, status post left-sided stenting, atrial flutter, aortic stenosis who presented to the emergency department with shortness of breath and congestion over a 2 to 3-week timeframe.  He has had discolored sputum from his tracheostomy.  He denies any chills, fever but has had diaphoresis.  He complains of weakness status post falls x2.  In the emergency department patient was noted to be hypotensive and tachycardic.  Pertinent labs reveal a creatinine of 2.14, white count 12.6, lactic acid of 6.2 and an elevated troponin of 8.  Cardiology has been consulted and he was started empirically on heparin for an STEMI.  His EKG revealed sinus tachycardia, left axis deviation with ST segment depressions noted diffusely across the anterior precordial leads.  Lines, Airways, Drains: Urethral Catheter Melissa, RN 16 Fr. (Active)  Indication for Insertion or Continuance of Catheter Unstable critical patients (first 24-48 hours) 09/10/2018  7:58 AM  Site Assessment Clean;Intact 09/10/2018  1:30 AM  Catheter Maintenance Bag below level of bladder;Catheter secured;Drainage bag/tubing not touching floor;Insertion date on drainage bag;No dependent loops;Seal intact;Bag emptied prior to transport 09/10/2018  7:58 AM  Collection Container Standard drainage bag 09/10/2018  1:30 AM  Securement Method Securing device (Describe) 09/10/2018  1:30 AM  Urinary Catheter Interventions Unclamped 09/10/2018  1:30 AM  Output (mL) 60 mL 09/10/2018  6:17 AM    Anti-infectives:  Anti-infectives (From admission, onward)   Start     Dose/Rate Route Frequency Ordered Stop   09/10/18 1000  ceFEPIme (MAXIPIME) 2 g in sodium  chloride 0.9 % 100 mL IVPB     2 g 200 mL/hr over 30 Minutes Intravenous Every 24 hours 09/14/2018 1254     09/14/2018 1500  vancomycin (VANCOCIN) IVPB 1000 mg/200 mL premix     1,000 mg 200 mL/hr over 60 Minutes Intravenous Every 24 hours 09/03/2018 1312     09/13/2018 1400  ceFEPIme (MAXIPIME) 1 g in sodium chloride 0.9 % 100 mL IVPB     1 g 200 mL/hr over 30 Minutes Intravenous  Once 09/10/2018 1253 09/14/2018 1605   09/25/2018 0915  vancomycin (VANCOCIN) IVPB 1000 mg/200 mL premix     1,000 mg 200 mL/hr over 60 Minutes Intravenous  Once 09/15/2018 0905 09/20/2018 1113   09/12/2018 0915  ceFEPIme (MAXIPIME) 1 g in sodium chloride 0.9 % 100 mL IVPB     1 g 200 mL/hr over 30 Minutes Intravenous  Once 09/17/2018 0905 09/20/2018 0950      Microbiology: Results for orders placed or performed during the hospital encounter of 09/06/2018  Culture, blood (routine x 2)     Status: None (Preliminary result)   Collection Time: 09/25/2018  8:35 AM  Result Value Ref Range Status   Specimen Description BLOOD R AC  Final   Special Requests   Final    BOTTLES DRAWN AEROBIC AND ANAEROBIC Blood Culture results may not be optimal due to an excessive volume of blood received in culture bottles   Culture   Final    NO GROWTH < 24 HOURS Performed at [facility], [address] [place], [place] [address]    Report Status PENDING  Incomplete  Culture, blood (routine x 2)     Status: None (Preliminary result)  Collection Time: 09/29/2018  8:47 AM  Result Value Ref Range Status   Specimen Description BLOOD  LAC  Final   Special Requests   Final    BOTTLES DRAWN AEROBIC AND ANAEROBIC Blood Culture results may not be optimal due to an excessive volume of blood received in culture bottles   Culture   Final    NO GROWTH < 24 HOURS Performed at [facility], [address], [place], [place] [address]    Report Status PENDING  Incomplete  MRSA PCR Screening     Status: None   Collection Time: 09/17/2018 12:38 PM   Result Value Ref Range Status   MRSA by PCR NEGATIVE NEGATIVE Final    Comment:        The GeneXpert MRSA Assay (FDA approved for NASAL specimens only), is one component of a comprehensive MRSA colonization surveillance program. It is not intended to diagnose MRSA infection nor to guide or monitor treatment for MRSA infections. Performed at [facility], [address] [place], [place] [address]   Culture, respiratory (non-expectorated)     Status: None (Preliminary result)   Collection Time: 09/29/2018 12:56 PM  Result Value Ref Range Status   Specimen Description   Final    TRACHEAL ASPIRATE Performed at [facility], [address] [place], [place] [address]    Special Requests   Final    Normal Performed at [facility], [place], [place] [address]    Gram Stain   Final    RARE WBC PRESENT, PREDOMINANTLY PMN FEW GRAM POSITIVE RODS RARE GRAM POSITIVE COCCI Performed at [facility], [place] [address] [place], [place] [address]    Culture PENDING  Incomplete   Report Status PENDING  Incomplete     Studies: Dg Chest 2 View  Result Date: 09/08/2018 CLINICAL DATA:  Pt presents via EMS c/o SOB and falls x2. Pt has trach however removed by pt per EMS report. Green mucus noted on exam from trach. Pt has small abrasion noted to back of head s/p fall. Pt unable to talk at this time due to trach. Hx - Aortic stenosis, atrial flutter, CAD w/ CABG, CHF, diabetes, HTN, throat cancer, former smoker quit 1995, smoked 3 ppd. EXAM: CHEST - 2 VIEW COMPARISON:  5/30/7 FINDINGS: Stable changes from prior CABG surgery. Cardiac silhouette is mildly enlarged. No mediastinal or hilar masses. No convincing adenopathy. Lungs are clear.  No pleural effusion or pneumothorax. Skeletal structures are grossly intact. IMPRESSION: 1. No acute cardiopulmonary disease. 2. Cardiomegaly. Electronically Signed   By: Lajean Manes M.D.   On: 09/14/2018 08:43    Ct Head Wo Contrast  Result Date: 09/03/2018 CLINICAL DATA:  Falling over the last 2 days with trauma to the head. Shortness of breath. EXAM: CT HEAD WITHOUT CONTRAST TECHNIQUE: Contiguous axial images were obtained from the base of the skull through the vertex without intravenous contrast. COMPARISON:  06/28/2012 FINDINGS: Brain: Generalized atrophy. Chronic small-vessel ischemic changes of the thalami, basal ganglia and cerebral hemispheric white matter. No cortical or large vessel territory infarction. No evidence of mass lesion, hemorrhage, hydrocephalus or extra-axial collection. Vascular: There is atherosclerotic calcification of the major vessels at the base of the brain. Skull: Negative Sinuses/Orbits: Clear/normal Other: None IMPRESSION: No acute or traumatic finding. Atrophy and chronic small-vessel ischemic changes throughout the brain. Electronically Signed   By: Nelson Chimes M.D.   On: 09/04/2018 10:39   Dg Chest [facility]  Result Date: 09/10/2018 CLINICAL DATA:  Acute respiratory failure EXAM: PORTABLE CHEST 1 VIEW COMPARISON:  Chest radiograph 09/14/2018 FINDINGS: Tracheostomy tube terminates in the proximal trachea. Monitoring leads overlie the patient. Stable cardiomegaly status post median sternotomy. Interval development of patchy consolidation predominately within the right mid and lower lung. Minimal heterogeneous opacities left lung base. Probable small right pleural effusion. No pneumothorax. IMPRESSION: 1. Interval development of patchy consolidation within the right mid and lower lung concerning for pneumonia in the appropriate clinical setting. 2. Probable small right pleural effusion. Electronically Signed   By: Lovey Newcomer M.D.   On: 09/10/2018 07:39    Consults: Treatment Team:  Pccm, Ander Gaster, MD Minna Merritts, MD   Subjective:    Overnight Issues: Overnight the patient had progressive respiratory distress, required tracheostomy/cuffed to be placed and  started on mechanical ventilation this morning Respiratory therapy relates copious secretions Objective:  Vital signs for last 24 hours: Temp:  [98 F (36.7 C)-100 F (37.8 C)] 100 F (37.8 C) (11/10 0130) Pulse Rate:  [126-160] 126 (11/10 0727) Resp:  [12-49] 34 (11/10 0727) BP: (82-141)/(64-111) 119/78 (11/10 0700) SpO2:  [76 %-100 %] 92 % (11/10 0727) FiO2 (%):  [100 %] 100 % (11/10 0727) Weight:  [72.6 kg-76.6 kg] 76.6 kg (11/09 1233)  Hemodynamic parameters for last 24 hours:    Intake/Output from previous day: 11/09 0701 - 11/10 0700 In: 2534.7 [I.V.:1370.9; IV Piggyback:1163.8] Out: 230 [Urine:230]  Intake/Output this shift: No intake/output data recorded.  Vent settings for last 24 hours: Vent Mode: PRVC FiO2 (%):  [100 %] 100 % Set Rate:  [16 bmp] 16 bmp Vt Set:  [500 mL] 500 mL PEEP:  [5 cmH20] 5 cmH20  Physical Exam:   Vital signs:       Please see the above listed vital signs. Patient is awake, alert in no acute distress HEENT:           Trachea midline, tracheostomy site noted, discolored sputum noted no jugular venous distention or accessory muscle utilization Cardiovascular:           Tachycardia appreciated with systolic murmur 2/6 Pulmonary:      Coarse rhonchi appreciated bilaterally Extremities:     No clubbing, cyanosis or edema noted Abdominal:      Positive bowel sounds, soft exam Cutaneous:      No rashes or lesions noted  Assessment/Plan:  Respiratory distress.    Presently on vancomycin and cefepime, pending sputum results, placed on mechanical ventilation secondary to progressive respiratory distress.  Chest x-ray now reveals patchy right middle and lower lobe pneumonia we will also send off urine for Legionella and pneumococcus.  Will obtain PICC line placement for periodic hypotension, multiple drips to include amiodarone, Precedex and frequent blood draws as patient is likely to be on mechanical ventilation and in the ICU for some  time  NSTEMI.    [facility] cardiology consultation.  IV heparin, aspirin, Plavix.  Patient with known ischemic cardiomyopathy with ejection fraction of 30%.  Troponin 27.07  Lactic acidosis.   Will recheck lactic acid, anion gap is 15, CO2 of 17, delta gap of 3, calculated initial starting bicarb of 20 consistent with both an anion gap and non-anion gap metabolic acidosis.  Most likely in the setting of hypotension, sepsis, volume resuscitation with normal saline  Leukocytosis.  With toxic granulations concerning for sepsis  Critical Care Total Time 40 minutes  Eulanda Dorion 09/10/2018  *Care during the described time interval was provided by me and/or  other providers on the critical care team.  I have reviewed this patient's available data, including medical history, events of note, physical examination and test results as part of my evaluation.

## 2018-09-10 NOTE — Progress Notes (Signed)
[facility] [place] NAME: Mitchell Huffman    MR#:  [id number]  DATE OF BIRTH:  [birth year]  SUBJECTIVE:   Patient currently lethargic. No family in the ICU. REVIEW OF SYSTEMS:   Review of Systems  Unable to perform ROS: Mental acuity   Tolerating Diet: Tolerating PT:   DRUG ALLERGIES:  No Known Allergies  VITALS:  Blood pressure (!) 81/66, pulse (!) 127, temperature 98.3 F (36.8 C), temperature source Axillary, resp. rate (!) 34, height 5\' 6"  (1.676 m), weight 76.6 kg, SpO2 92 %.  PHYSICAL EXAMINATION:   Physical Exam  GENERAL:  77 y.o.-year-old patient lying in the bed with no acute distress. He is chronically ill EYES: Pupils equal, round, reactive to light and accommodation. No scleral icterus. Extraocular muscles intact.  HEENT: Head atraumatic, normocephalic. Oropharynx and nasopharynx clear. Trach + NECK:  Supple, no jugular venous distention. No thyroid enlargement, no tenderness.  LUNGS: Normal breath sounds bilaterally, no wheezing, rales, rhonchi. No use of accessory muscles of respiration.  CARDIOVASCULAR: S1, S2 normal. No murmurs, rubs, or gallops.  ABDOMEN: Soft, nontender, nondistended. Bowel sounds present. No organomegaly or mass.  EXTREMITIES: No cyanosis, clubbing or edema b/l.    NEUROLOGIC: unable to assess--due to acuity of illness PSYCHIATRIC:  patient is lethargic SKIN: No obvious rash, lesion, or ulcer.   LABORATORY PANEL:  CBC Recent Labs  Lab 09/10/18 0340  WBC 11.6*  HGB 13.5  HCT 41.8  PLT 155    Chemistries  Recent Labs  Lab 09/28/2018 0821  09/10/18 0340 09/10/18 0343  NA 137   < > 138  --   K 4.1   < > 4.3  --   CL 98   < > 106  --   CO2 23   < > 17*  --   GLUCOSE 222*   < > 159*  --   BUN 22   < > 30*  --   CREATININE 2.14*   < > 2.00*  --   CALCIUM 9.2   < > 7.8*  --   MG  --    < >  --  2.3  AST 140*  --   --   --   ALT 40  --   --   --   ALKPHOS 70  --   --   --    BILITOT 0.8  --   --   --    < > = values in this interval not displayed.   Cardiac Enzymes Recent Labs  Lab 09/10/18 0038  TROPONINI 27.07*   RADIOLOGY:  Dg Chest 2 View  Result Date: 09/04/2018 CLINICAL DATA:  Pt presents via EMS c/o SOB and falls x2. Pt has trach however removed by pt per EMS report. Green mucus noted on exam from trach. Pt has small abrasion noted to back of head s/p fall. Pt unable to talk at this time due to trach. Hx - Aortic stenosis, atrial flutter, CAD w/ CABG, CHF, diabetes, HTN, throat cancer, former smoker quit 1995, smoked 3 ppd. EXAM: CHEST - 2 VIEW COMPARISON:  5/30/7 FINDINGS: Stable changes from prior CABG surgery. Cardiac silhouette is mildly enlarged. No mediastinal or hilar masses. No convincing adenopathy. Lungs are clear.  No pleural effusion or pneumothorax. Skeletal structures are grossly intact. IMPRESSION: 1. No acute cardiopulmonary disease. 2. Cardiomegaly. Electronically Signed   By: Lajean Manes M.D.   On: 09/22/2018 08:43   Ct Head Wo  Contrast  Result Date: 09/14/2018 CLINICAL DATA:  Falling over the last 2 days with trauma to the head. Shortness of breath. EXAM: CT HEAD WITHOUT CONTRAST TECHNIQUE: Contiguous axial images were obtained from the base of the skull through the vertex without intravenous contrast. COMPARISON:  06/28/2012 FINDINGS: Brain: Generalized atrophy. Chronic small-vessel ischemic changes of the thalami, basal ganglia and cerebral hemispheric white matter. No cortical or large vessel territory infarction. No evidence of mass lesion, hemorrhage, hydrocephalus or extra-axial collection. Vascular: There is atherosclerotic calcification of the major vessels at the base of the brain. Skull: Negative Sinuses/Orbits: Clear/normal Other: None IMPRESSION: No acute or traumatic finding. Atrophy and chronic small-vessel ischemic changes throughout the brain. Electronically Signed   By: Nelson Chimes M.D.   On: 09/12/2018 10:39   Dg Chest  Port 1 View  Result Date: 09/10/2018 CLINICAL DATA:  Acute respiratory failure EXAM: PORTABLE CHEST 1 VIEW COMPARISON:  Chest radiograph 09/23/2018 FINDINGS: Tracheostomy tube terminates in the proximal trachea. Monitoring leads overlie the patient. Stable cardiomegaly status post median sternotomy. Interval development of patchy consolidation predominately within the right mid and lower lung. Minimal heterogeneous opacities left lung base. Probable small right pleural effusion. No pneumothorax. IMPRESSION: 1. Interval development of patchy consolidation within the right mid and lower lung concerning for pneumonia in the appropriate clinical setting. 2. Probable small right pleural effusion. Electronically Signed   By: Lovey Newcomer M.D.   On: 09/10/2018 07:39   [place] Ekg Site Rite  Result Date: 09/10/2018 If Site Rite image not attached, placement could not be confirmed due to current cardiac rhythm.  ASSESSMENT AND PLAN:  Mitchell Huffman  is a 77 y.o. male with a known history of moderate to severe aortic stenosis, paroxysmal atrial flutter, CAD s/p 4 vessel CABG, chronic systolic CHF, E3PI, HTN, HLD, hx stroke x 2, throat cancer with trach who presented to the ED with shortness of breath and congestion for the last 2 to 3 weeks In the ED, he was hypotensive to the 80s/60s and tachycardic to the 130s.  Labs were significant for creatinine 2.14, WBC 12.6, lactic acid 6.2, troponin 8.  Chest x-ray and CT head were negative.  He was started on heparin for presumed NSTEMI.  Code sepsis was also called due to his tachycardia and leukocytosis.  He was started on vancomycin and cefepime  *Sepsis- may be secondary to CAP, given his congestion, shortness of breath, and green/brown tracheal aspirate.  Patient with leukocytosis and tachycardia on admission. -Continue vancomycin and cefepime -Blood cultures negative -Trach aspirate culture few gram-positive rods. Rare grams positive cocci -patient placed on the  ventilator through his tracheostomy.  * Acute  NSTEMI vs demand ischemia in the setting of sepsis and AKI. Initial troponin 8.  Not having any active chest pain.  Does have a history of CAD status post CABG x4. -Continue heparin drip -Cardiology consult appreciated-- the new conservative medical management. Patient a poor candidate for cardiac cath -Continue daily aspirin and plavix -Holding beta-blocker due to hypotension -ECHO  *Lactic acidosis- likely related to above conditions -s/p 1.5L bolus -Trend lactic acid  *Acute kidney injury- likely prerenal in the setting of hypotension -Holding losartan -Avoid nephrotoxic agents -creat 2.0  *Sinus tachycardia with a history of paroxysmal atrial flutter- not on anticoagulation at home. Previously on digoxin at home, but patient no longer taking this -Holding beta blocker due to hypotension  *Hypotension with a history of hypertension- BPs in the 80s/60s -Holding losartan and metoprolol -May need pressors  *  Severe aortic stenosis- seen on last ECHO from 2017. -Repeat ECHO  *Chronic systolic CHF-does not appear volume overloaded. Last ECHO with EF 30-35%. -Has received 1.5L bolus, so will need to monitor respiratory status closely.  T2DM- hyperglycemic in the ED -Sensitive SSI  Hyperlipidemia- stable -Continue Crestor   Hypothyroidism- stable -Continue Synthroid  Throat cancer s/p trach- on blow by O2 in the ED -Trach care  Case discussed with Care Management/Social Worker. Management plans discussed with the patient, family and they are in agreement.  CODE STATUS: Full  DVT Prophylaxis: heparin GTT  TOTAL TIME TAKING CARE OF THIS PATIENT: *30* minutes.  >50% time spent on counselling and coordination of care  POSSIBLE D/C IN *feqw* DAYS, DEPENDING ON CLINICAL CONDITION.  Note: This dictation was prepared with Dragon dictation along with smaller phrase technology. Any transcriptional errors that result  from this process are unintentional.  Fritzi Mandes M.D on 09/10/2018 at 1:37 PM  Between 7am to 6pm - Pager - [phone]  After 6pm go to [email or web address] - password [email or web address] [facility]  Office  [phone]  CC: Primary care physician; Jodi Marble, MDPatient ID: Mitchell Huffman, male   DOB: 1940-12-05, 77 y.o.   MRN: [id number]

## 2018-09-10 NOTE — Progress Notes (Addendum)
Spoke with Tiffany re PICC order placed this am.  Pt has 3 PIV working well per [employer].  Latest SBP's 60-70's.  Meds are compatible and pt has enough IV access currently.  Notified per [facility] policy, it is ok to run vasopressors via PIV.  Since pt has adequate access and pending blood cultures with latest temperature charted as 100, will plan on PICC placement in the am once [facility] results are negative x 48hrs.  Tiffany states that since VAS Team is not placing PICC, they will notify CVW. Recommended to wait for blood cultures to prevent potential seeding of the PICC/CLABSI.  Tiffany states she will notify CVW.

## 2018-09-10 NOTE — Progress Notes (Signed)
Trach changed due to #6 Shiley cuffed dislodged when pt moved. #6 Shiley XLTD inserted without difficulty. RN at bedside to assist. MD notified.

## 2018-09-10 NOTE — Progress Notes (Signed)
ANTICOAGULATION CONSULT NOTE - Initial Consult  Pharmacy Consult for heparin Indication: chest pain/ACS  No Known Allergies  Patient Measurements: Height: 5\' 6"  (167.6 cm) Weight: 168 lb 14 oz (76.6 kg) IBW/kg (Calculated) : 63.8 Heparin Dosing Weight: 72.6 kg  Vital Signs: Temp: 100 F (37.8 C) (11/10 0130) Temp Source: Oral (11/10 0130) BP: 137/71 (11/10 0400) Pulse Rate: 129 (11/10 0400)  Labs: Recent Labs    09/15/2018 0821 09/17/2018 1252 09/15/2018 1905 09/14/2018 2122 09/10/18 0038 09/10/18 0340  HGB 13.9  --   --   --   --  13.5  HCT 40.7  --   --   --   --  41.8  PLT 172  --   --   --   --  155  APTT 33  --   --   --   --   --   LABPROT 15.6*  --   --   --   --   --   INR 1.25  --   --   --   --   --   HEPARINUNFRC  --   --  0.42  --   --  0.27*  CREATININE 2.14*  --   --  1.71*  --  2.00*  TROPONINI 8.03* 23.15* 23.42*  --  27.07*  --     Estimated Creatinine Clearance: 30.1 mL/min (A) (by C-G formula based on SCr of 2 mg/dL (H)).    Assessment: 77 yo male here with possible NSTEMI and possible PNA Pt does not appear to be on oral anticoagulant per PTA med list  Heparin Course: 11/9 AM initiation 4000 unit bolus, then 850 units/hr 11/9 1905 HL 0.42  Goal of Therapy:  Heparin level 0.3-0.7 units/ml Monitor platelets by anticoagulation protocol: Yes   Plan:  11/10 @ 0340 HL 0.27 subtherapeutic. Will rebolus w/ heparin 1200 units IV x 1 and increase rate to 1000 units/hr and will recheck HL @ 1300. CBC stable will continue to monitor. Trops have been trending up will continue to monitor.  Tobie Lords, PharmD, BCPS Clinical Pharmacist 09/10/2018

## 2018-09-10 NOTE — Progress Notes (Signed)
Pt. On aerosol trach collar at FiO2 100% with SpO2 84%. Changed to Optiflow Trach collar 50L @ 100% FiO2. With improved SPO2 of 92-94%.  Will continue to assess.

## 2018-09-10 NOTE — Progress Notes (Signed)
In house PICC team refusing to place line, vascular wellness to place PICC per Dr. Jefferson Fuel.

## 2018-09-10 NOTE — Progress Notes (Signed)
ANTICOAGULATION CONSULT NOTE - Initial Consult  Pharmacy Consult for heparin Indication: chest pain/ACS  No Known Allergies  Patient Measurements: Height: 5\' 6"  (167.6 cm) Weight: 168 lb 14 oz (76.6 kg) IBW/kg (Calculated) : 63.8 Heparin Dosing Weight: 72.6 kg  Vital Signs: Temp: 98.3 F (36.8 C) (11/10 1100) Temp Source: Axillary (11/10 1100) BP: 81/66 (11/10 1300) Pulse Rate: 127 (11/10 1300)  Labs: Recent Labs    09/28/2018 0821 09/08/2018 1252 09/26/2018 1905 09/07/2018 2122 09/10/18 0038 09/10/18 0340 09/10/18 1258  HGB 13.9  --   --   --   --  13.5  --   HCT 40.7  --   --   --   --  41.8  --   PLT 172  --   --   --   --  155  --   APTT 33  --   --   --   --   --   --   LABPROT 15.6*  --   --   --   --   --   --   INR 1.25  --   --   --   --   --   --   HEPARINUNFRC  --   --  0.42  --   --  0.27* 0.33  CREATININE 2.14*  --   --  1.71*  --  2.00*  --   TROPONINI 8.03* 23.15* 23.42*  --  27.07*  --   --     Estimated Creatinine Clearance: 30.1 mL/min (A) (by C-G formula based on SCr of 2 mg/dL (H)).    Assessment: 77 yo male here with possible NSTEMI and possible PNA Pt does not appear to be on oral anticoagulant per PTA med list  Heparin Course: 11/9 AM initiation 4000 unit bolus, then 850 units/hr 11/9 1905 HL 0.42 11/10 0340 HL 0.27 11/10 1258 HL 0.33  Goal of Therapy:  Heparin level 0.3-0.7 units/ml Monitor platelets by anticoagulation protocol: Yes   Plan:  Heparin level therapeutic at 0.33 Continue heparin at same rate and check another level in 6 hours for confrimation  Beverlyn Mcginness D Leani Myron, Pharm.D, BCPS Clinical Pharmacist 09/10/2018

## 2018-09-11 ENCOUNTER — Inpatient Hospital Stay: Payer: Medicare HMO

## 2018-09-11 DIAGNOSIS — Z789 Other specified health status: Secondary | ICD-10-CM

## 2018-09-11 DIAGNOSIS — Z7189 Other specified counseling: Secondary | ICD-10-CM

## 2018-09-11 DIAGNOSIS — R570 Cardiogenic shock: Secondary | ICD-10-CM

## 2018-09-11 DIAGNOSIS — R579 Shock, unspecified: Secondary | ICD-10-CM

## 2018-09-11 DIAGNOSIS — J9601 Acute respiratory failure with hypoxia: Secondary | ICD-10-CM

## 2018-09-11 DIAGNOSIS — I214 Non-ST elevation (NSTEMI) myocardial infarction: Secondary | ICD-10-CM

## 2018-09-11 DIAGNOSIS — N179 Acute kidney failure, unspecified: Secondary | ICD-10-CM

## 2018-09-11 LAB — BASIC METABOLIC PANEL
Anion gap: 12 (ref 5–15)
BUN: 50 mg/dL — AB (ref 8–23)
CO2: 20 mmol/L — AB (ref 22–32)
Calcium: 7.6 mg/dL — ABNORMAL LOW (ref 8.9–10.3)
Chloride: 108 mmol/L (ref 98–111)
Creatinine, Ser: 3.18 mg/dL — ABNORMAL HIGH (ref 0.61–1.24)
GFR calc Af Amer: 20 mL/min — ABNORMAL LOW (ref >60)
GFR, EST NON AFRICAN AMERICAN: 17 mL/min — AB (ref >60)
GLUCOSE: 135 mg/dL — AB (ref 70–99)
POTASSIUM: 4.5 mmol/L (ref 3.5–5.1)
Sodium: 140 mmol/L (ref 135–145)

## 2018-09-11 LAB — CBC
HCT: 37.1 % — ABNORMAL LOW (ref 39.0–52.0)
HEMOGLOBIN: 12.6 g/dL — AB (ref 13.0–17.0)
MCH: 32.7 pg (ref 26.0–34.0)
MCHC: 34 g/dL (ref 30.0–36.0)
MCV: 96.4 fL (ref 80.0–100.0)
Platelets: 160 10*3/uL (ref 150–400)
RBC: 3.85 MIL/uL — ABNORMAL LOW (ref 4.22–5.81)
RDW: 12.9 % (ref 11.5–15.5)
WBC: 11.4 10*3/uL — ABNORMAL HIGH (ref 4.0–10.5)
nRBC: 0.8 % — ABNORMAL HIGH (ref 0.0–0.2)

## 2018-09-11 LAB — HEPARIN LEVEL (UNFRACTIONATED): HEPARIN UNFRACTIONATED: 0.43 [IU]/mL (ref 0.30–0.70)

## 2018-09-11 LAB — ECHOCARDIOGRAM COMPLETE
HEIGHTINCHES: 66 in
Weight: 2701.96 oz

## 2018-09-11 LAB — GLUCOSE, CAPILLARY
GLUCOSE-CAPILLARY: 106 mg/dL — AB (ref 70–99)
Glucose-Capillary: 119 mg/dL — ABNORMAL HIGH (ref 70–99)
Glucose-Capillary: 122 mg/dL — ABNORMAL HIGH (ref 70–99)
Glucose-Capillary: 95 mg/dL (ref 70–99)

## 2018-09-11 LAB — BRAIN NATRIURETIC PEPTIDE: B Natriuretic Peptide: 2033 pg/mL — ABNORMAL HIGH (ref 0.0–100.0)

## 2018-09-11 LAB — LACTIC ACID, PLASMA: Lactic Acid, Venous: 2.1 mmol/L (ref 0.5–1.9)

## 2018-09-11 MED ORDER — GLYCOPYRROLATE 1 MG PO TABS: 1.0000 mg | ORAL_TABLET | ORAL | Status: DC | PRN

## 2018-09-11 MED ORDER — ACETAMINOPHEN 325 MG PO TABS: 650.0000 mg | ORAL_TABLET | Freq: Four times a day (QID) | ORAL | Status: DC | PRN

## 2018-09-11 MED ORDER — GLYCOPYRROLATE 0.2 MG/ML IJ SOLN: 0.2000 mg | INTRAMUSCULAR | Status: DC | PRN

## 2018-09-11 MED ORDER — ROSUVASTATIN CALCIUM 10 MG PO TABS: 40.0000 mg | ORAL_TABLET | Freq: Every evening | ORAL | Status: DC

## 2018-09-11 MED ORDER — CLOPIDOGREL BISULFATE 75 MG PO TABS: 75.0000 mg | ORAL_TABLET | Freq: Every evening | ORAL | Status: DC

## 2018-09-11 MED ORDER — IPRATROPIUM-ALBUTEROL 0.5-2.5 (3) MG/3ML IN SOLN: 3.0000 mL | RESPIRATORY_TRACT | Status: DC | PRN

## 2018-09-11 MED ORDER — SODIUM CHLORIDE 0.9 % IV SOLN
0.0000 ug/min | INTRAVENOUS | Status: DC
  Administered 2018-09-11: 300 ug/min via INTRAVENOUS
  Filled 2018-09-11: qty 40

## 2018-09-11 MED ORDER — FAMOTIDINE 20 MG PO TABS
20.0000 mg | ORAL_TABLET | Freq: Every day | ORAL | Status: DC
  Filled 2018-09-11: qty 1

## 2018-09-11 MED ORDER — DIPHENHYDRAMINE HCL 50 MG/ML IJ SOLN: 25.0000 mg | INTRAMUSCULAR | Status: DC | PRN

## 2018-09-11 MED ORDER — MORPHINE SULFATE (PF) 2 MG/ML IV SOLN
2.0000 mg | INTRAVENOUS | Status: DC | PRN
  Administered 2018-09-11: 4 mg via INTRAVENOUS
  Filled 2018-09-11: qty 2

## 2018-09-11 MED ORDER — ASPIRIN 81 MG PO CHEW
81.0000 mg | CHEWABLE_TABLET | Freq: Every day | ORAL | Status: DC
  Filled 2018-09-11: qty 1

## 2018-09-11 MED ORDER — ACETAMINOPHEN 650 MG RE SUPP: 650.0000 mg | Freq: Four times a day (QID) | RECTAL | Status: DC | PRN

## 2018-09-11 MED ORDER — POLYVINYL ALCOHOL 1.4 % OP SOLN
1.0000 [drp] | Freq: Four times a day (QID) | OPHTHALMIC | Status: DC | PRN
  Filled 2018-09-11: qty 15

## 2018-09-11 MED ORDER — LEVOTHYROXINE SODIUM 50 MCG PO TABS: 175.0000 ug | ORAL_TABLET | Freq: Every day | ORAL | Status: DC

## 2018-09-11 MED ORDER — MORPHINE 100MG IN NS 100ML (1MG/ML) PREMIX INFUSION
5.0000 mg/h | INTRAVENOUS | Status: DC
  Administered 2018-09-11: 5 mg/h via INTRAVENOUS
  Filled 2018-09-11: qty 100

## 2018-09-11 MED ORDER — DEXTROSE 5 % IV SOLN: INTRAVENOUS | Status: DC

## 2018-09-12 LAB — CULTURE, RESPIRATORY: CULTURE: NORMAL

## 2018-09-12 LAB — CULTURE, RESPIRATORY W GRAM STAIN: Special Requests: NORMAL

## 2018-09-14 ENCOUNTER — Telehealth: Payer: Self-pay | Admitting: Pulmonary Disease

## 2018-09-14 LAB — CULTURE, BLOOD (ROUTINE X 2)
CULTURE: NO GROWTH
Culture: NO GROWTH

## 2018-09-14 NOTE — Telephone Encounter (Signed)
Recieved Death Certificate from _[employer]_________ Delivered/Placed ______nurse box______

## 2018-09-14 NOTE — Telephone Encounter (Signed)
Death certificate placed in DS folder for completion. 

## 2018-09-15 NOTE — Telephone Encounter (Signed)
Death certificate complete and placed at front desk. [employer] aware. Faxed one copy.

## 2018-10-01 NOTE — Progress Notes (Signed)
Patient is taken off vent and placed on 50% fio2 trach collar

## 2018-10-01 NOTE — Progress Notes (Signed)
   2018/10/10 1325  Clinical Encounter Type  Visited With Patient and family together  Visit Type Follow-up;Patient actively dying  Referral From [place] Prayer;Emotional;Grief support   Unit chaplain responded to page from on-call chaplain to support family.  Chaplain engaged family in life and faith review of patient and his relationships.  Chaplain offered prayer for peaceful transition for patient and support for family and loved ones.  Chaplain maintained a calming presence, offering silent and energetic prayers as family spoke to patient.  Patient's sister spoke of multiple losses in last 7 months and impact patient's death has on loss history.  After patient's death, family expressed desire to spend some time alone with patient.  Chaplain to follow up.

## 2018-10-01 NOTE — Progress Notes (Signed)
   09/12/18 1215  Clinical Encounter Type  Visited With Patient and family together  Visit Type Initial;Spiritual support;Critical Care;Patient actively dying  Referral From Nurse  Consult/Referral To Chaplain  Spiritual Encounters  Spiritual Needs Prayer;Emotional;Grief support   CH received a page to support patient's family at end of life. I prayed with the family and offered spiritual support and pastoral presence. The patient and family care was passed off to [facility].

## 2018-10-01 NOTE — Progress Notes (Signed)
Pt profile: 29 male with htn, DM2, CVA, throat cancer, chronic trach tube, CAD, CABG, PAF/flutter, AS admitted 11/09 with increasing dyspnea x 2-3 weeks. Required trach change to cuffed trach for vent support. Admission labs notable for trop I of 8 (has risen to 27) and lactate > 6. PCT also elevated    Lines, Tubes, etc: Trach (chronic) RUE PICC 11/10 >>   Microbiology: MRSA PCR 11/09 >> NEG Resp 11/11 >>  Blood 11/09 >>    Antibiotics:  Anti-infectives (From admission, onward)   Start     Dose/Rate Route Frequency Ordered Stop   09/10/18 1000  ceFEPIme (MAXIPIME) 2 g in sodium chloride 0.9 % 100 mL IVPB     2 g 200 mL/hr over 30 Minutes Intravenous Every 24 hours 09/05/2018 1254     09/04/2018 1500  vancomycin (VANCOCIN) IVPB 1000 mg/200 mL premix  Status:  Discontinued     1,000 mg 200 mL/hr over 60 Minutes Intravenous Every 24 hours 09/07/2018 1312 2018/09/24 0834   09/21/2018 1400  ceFEPIme (MAXIPIME) 1 g in sodium chloride 0.9 % 100 mL IVPB     1 g 200 mL/hr over 30 Minutes Intravenous  Once 09/16/2018 1253 09/10/2018 1605   09/06/2018 0915  vancomycin (VANCOCIN) IVPB 1000 mg/200 mL premix     1,000 mg 200 mL/hr over 60 Minutes Intravenous  Once 09/20/2018 0905 09/20/2018 1113   09/19/2018 0915  ceFEPIme (MAXIPIME) 1 g in sodium chloride 0.9 % 100 mL IVPB     1 g 200 mL/hr over 30 Minutes Intravenous  Once 09/12/2018 0905 09/25/2018 0950         Studies/Events: 11/09 CT head: no acute findings 11/10 Echocardiogram: LVEF 10%, moderate AS 11/11 developed atrial fib/flutter with tachycardia and shock  Consults:  ENT 11/10 Cardiology 11/10   Best Practice: DVT: full dose hearin SUP: enteral famotidine Nutrition: NPO Glycemic control: SSI Sedation/analgesia: PAD protocol  Subj: RASS -3, -4. Not F/C.  Developed fib/flutter this a.m. with worsening hypotension/shock  Obj: Vitals:   09-24-2018 0800 2018/09/24 0900  BP: (!) 82/65 (!) 73/53  Pulse:    Resp: (!) 30 (!) 28  Temp: 98.3 F  (36.8 C)   SpO2: 96%     Gen: chronically ill appearing HEENT: very poor dentition Neck: trach site clean Chest: few rhonchi, no wheezes Cardiac: tachy, reg, 2:1 flutter, soft syst M VHQ:IONG, NT, diminished BS Ext: cool, no edema Neuro: MAEs  BMP Latest Ref Rng & Units 2018-09-24 09/10/2018 09/20/2018  Glucose 70 - 99 mg/dL 135(H) 159(H) 138(H)  BUN 8 - 23 mg/dL 50(H) 30(H) 26(H)  Creatinine 0.61 - 1.24 mg/dL 3.18(H) 2.00(H) 1.71(H)  Sodium 135 - 145 mmol/L 140 138 136  Potassium 3.5 - 5.1 mmol/L 4.5 4.3 4.2  Chloride 98 - 111 mmol/L 108 106 108  CO2 22 - 32 mmol/L 20(L) 17(L) 17(L)  Calcium 8.9 - 10.3 mg/dL 7.6(L) 7.8(L) 7.8(L)      CBC Latest Ref Rng & Units 2018-09-24 09/10/2018 09/30/2018  WBC 4.0 - 10.5 K/uL 11.4(H) 11.6(H) 12.6(H)  Hemoglobin 13.0 - 17.0 g/dL 12.6(L) 13.5 13.9  Hematocrit 39.0 - 52.0 % 37.1(L) 41.8 40.7  Platelets 150 - 400 K/uL 160 155 172      CXR: cardiomegaly, mod-large R pl eff, possible RLL infiltrate   IMPRESSION: 1) laryngeal cancer, chronic tracheostomy tube 2) history of coronary artery disease with ischemic cardiomyopathy 3) history of aortic stenosis 4) history of atrial flutter 5) acute hypoxemic respiratory failure 6) suspect severe sepsis  due to CAP 7) pulmonary edema 8) shock-likely cardiogenic >septic 9) acute myocardial infarction 10) very severe dilated cardiomyopathy 11) AKI with oliguria 12) acute encephalopathy  Given his poor baseline health status and very severe acute critical illness, likelihood of meaningful survival is vanishingly small.  I just learned of echocardiogram demonstrating very poor left ventricular function (reportedly approximately 10%).  As such, he is not a candidate for dialysis.  Likewise, I consider him a poor candidate for ACLS.  PLAN/RECS:   Cont full vent support - settings reviewed and/or adjusted Cont vent bundle Daily SBT if/when meets criteria Range norepinephrine to  phenylephrine Initiate amiodarone infusion Monitor BMET intermittently Monitor I/Os Correct electrolytes as indicated  Monitor temp, WBC count Micro and abx as above Cont PAD protocol DC dexmedetomidine Palliative care consultation requested I will meet with family today to discuss goals of care as his cardiac arrest seems imminent   CCM time: 45 mins  The above time includes time spent in consultation with patient and/or family members and reviewing care plan on multidisciplinary rounds  Merton Border, MD PCCM service Mobile [phone] Pager [phone] 10-09-2018 11:38 AM

## 2018-10-01 NOTE — Discharge Summary (Signed)
DEATH SUMMARY  DATE OF ADMISSION:  09-13-2018  DATE OF DISCHARGE/DEATH: 2018-09-15   ADMISSION DIAGNOSES:   Sepsis NSTEMI Lactic acidosis AKI Sinus tachycardia History of PAF Hypotension Severe AS Chronic systolic CHF DM, type 2 Hyperlipidemia Hypothyroidism History of throat cancer Chronic tracheostomy tube   DISCHARGE DIAGNOSES:   Possible sepsis Possible Pneumonia NSTEMI Lactic acidosis AKI Sinus tachycardia History of PAF Hypotension Severe AS Chronic systolic CHF DM, type 2 Hyperlipidemia Hypothyroidism History of throat cancer Chronic tracheostomy tube Acute hypoxemic respiratory failure 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 male with a known history of moderate to severe aortic stenosis, paroxysmal atrial flutter, CAD s/p 4 vessel CABG, chronic systolic CHF, J5KK, HTN, HLD, hx stroke x 2, throat cancer with trach who presented to the ED with shortness of breath and congestion for the last 2 to 3 weeks.  He has had a lot of clear/brown tracheal discharge.  He has been around his grandson who has been recently diagnosed with rhinovirus.  No fevers or chills at home, but family has noticed that he has been sweaty.  He is also fallen twice on his bottom in the last couple of days.  He seems much weaker than normal.  At baseline he uses a walker at home.  In the ED, he was hypotensive to the 80s/60s and tachycardic to the 130s.  Labs were significant for creatinine 2.14, WBC 12.6, lactic acid 6.2, troponin 8.  Chest x-ray and CT head were negative.  He was started on heparin for presumed NSTEMI.  Code sepsis was also called due to his tachycardia and leukocytosis.  He was started on vancomycin and cefepime.  He was given a 1.5L  bolus.  Hospitalists were called for admission.  HOSPITAL COURSE:   He was admitted via the ED with the above history and admitting diagnoses.  He was seen in consultation by the critical care medicine service on the day of admission.  Initially, blood pressure responded to fluid boluses.  On the day following admission he had increasing respiratory distress and otolaryngology consultation was obtained to change his tracheostomy tube out to a cuffed tube to permit mechanical ventilatory support.  Over the next 24 hours, he had increasing vasopressor requirements.  He had worsening tachyarrhythmias.  Norepinephrine was changed to phenylephrine.  Amiodarone infusion was initiated.  Dexmedetomidine was discontinued.  Despite these efforts at hemodynamic support, he had worsening hypotension with worsening renal function and oliguria.  Echocardiogram was performed demonstrating severe left ventricular dysfunction (LVEF 10-15%).  Troponin I continue to rise throughout the course of his hospitalization.  Because of his poor health status at baseline and the grim prognosis, I undertook goals of care discussion with the patient's wife, son and other family members.  I offered that we could continue our current support without escalation understanding that his death was probably eminent (within the next 24 hours).  Alternatively, I offered that we could discontinue our efforts and allow him to pass away peacefully.  After family had the opportunity to discuss amongst themselves, they chose withdrawal of ventilatory support and full comfort care.    Cause of death:  Cardiogenic shock, due to  Severe ischemic dilated cardiomyopathy, due to Acute myocardial infarction   Contributing factors: History of laryngeal cancer, respiratory failure, pulmonary  edema, AKI, acute encephalopathy, possible pneumonia  Autopsy: No  Smoking: Probable   Merton Border, MD PCCM service Mobile [phone] Pager  [phone] 09/12/2018 1:04 PM

## 2018-10-01 NOTE — Clinical Social Work Note (Signed)
CSW consulted by RN CM in regards to skilled nursing home placement. Patient resides at home, was admitted over the weekend to ICU. Patient is acutely ill and has not yet had a PT Evaluation. Please re-consult when CSW needs arise. Shela Leff MSW,LCSW [phone]

## 2018-10-01 NOTE — Progress Notes (Signed)
   October 10, 2018 1415  Clinical Encounter Type  Visited With Family  Visit Type Death  Spiritual Encounters  Spiritual Needs Emotional;Grief support   Chaplain followed up with patient's family at bedside, offering emotional and grief support, some dialogue regarding patient's life review and current place in heaven (to use family's language).  Family ready to leave, sister and niece reported that patient spouse has already checked in with staff regarding paperwork.

## 2018-10-01 NOTE — Progress Notes (Signed)
ANTICOAGULATION CONSULT NOTE - Initial Consult  Pharmacy Consult for heparin Indication: chest pain/ACS  No Known Allergies  Patient Measurements: Height: 5\' 6"  (167.6 cm) Weight: 168 lb 14 oz (76.6 kg) IBW/kg (Calculated) : 63.8 Heparin Dosing Weight: 72.6 kg  Vital Signs: Temp: 100.4 F (38 C) (11/11 0330) Temp Source: Oral (11/11 0330) BP: 145/90 (11/11 0330) Pulse Rate: 130 (11/11 0330)  Labs: Recent Labs    09/30/2018 0821 09/26/2018 1252  09/19/2018 1905 09/24/2018 2122 09/10/18 0038 09/10/18 0340 09/10/18 1258 09/10/18 2007 Sep 15, 2018 0433  HGB 13.9  --   --   --   --   --  13.5  --   --  12.6*  HCT 40.7  --   --   --   --   --  41.8  --   --  37.1*  PLT 172  --   --   --   --   --  155  --   --  160  APTT 33  --   --   --   --   --   --   --   --   --   LABPROT 15.6*  --   --   --   --   --   --   --   --   --   INR 1.25  --   --   --   --   --   --   --   --   --   HEPARINUNFRC  --   --    < > 0.42  --   --  0.27* 0.33 0.24* 0.43  CREATININE 2.14*  --   --   --  1.71*  --  2.00*  --   --  3.18*  TROPONINI 8.03* 23.15*  --  23.42*  --  27.07*  --   --   --   --    < > = values in this interval not displayed.    Estimated Creatinine Clearance: 19 mL/min (A) (by C-G formula based on SCr of 3.18 mg/dL (H)).    Assessment: 77 yo male here with atrial flutter w/RVR and multiple organ failure. Pt does not appear to be on oral anticoagulant per PTA med list. Hgb stable at this time  Heparin Course: 11/09 AM initiation 4000 unit bolus, then 850 units/hr 11/09 1905 HL 0.42 11/10 0340 HL 0.27: bolus with 1200 units, increase rate to 1000 units/hr 11/10 1258 HL 0.33 11/10 2007 HL 0.24  Goal of Therapy:  Heparin level 0.3-0.7 units/ml Monitor platelets by anticoagulation protocol: Yes   Plan:  11/11 @ 0500 HL 0.43 therapeutic. Will continue current rate and will recheck @ 1200. H/h has trended down, renal function appears to have increased as well, will continue to  monitor.  Pharmacy will continue to follow.   Tobie Lords, PharmD, BCPS Clinical Pharmacist 15-Sep-2018

## 2018-10-01 NOTE — Progress Notes (Signed)
Progress Note   Subjective   The patient is on the vent.  He has chronic trach.  He continues to be hypotensive and almost maxed out on Neo-Synephrine. Echo showed an EF of 10 to 15% with possible underlying low gradient severe aortic stenosis .  He continues to be in atrial flutter.  Inpatient Medications    Scheduled Meds: . aspirin  81 mg Per Tube Daily  . chlorhexidine gluconate (MEDLINE KIT)  15 mL Mouth Rinse BID  . clopidogrel  75 mg Per Tube QPM  . famotidine  20 mg Per Tube Daily  . insulin aspart  0-15 Units Subcutaneous Q4H  . [START ON 09/12/2018] levothyroxine  175 mcg Per Tube QAC breakfast  . mouth rinse  15 mL Mouth Rinse 10 times per day  . rosuvastatin  40 mg Per Tube QPM  . sodium chloride flush  10-40 mL Intracatheter Q12H   Continuous Infusions: . amiodarone 30 mg/hr (Sep 19, 2018 0808)  . ceFEPime (MAXIPIME) IV 2 g (19-Sep-2018 1047)  . heparin 1,150 Units/hr (09/19/2018 0800)  . phenylephrine (NEO-SYNEPHRINE) Adult infusion 300 mcg/min (Sep 19, 2018 1135)   PRN Meds: bisacodyl, fentaNYL (SUBLIMAZE) injection, ipratropium-albuterol, ondansetron (ZOFRAN) IV, sennosides, sodium chloride flush   Vital Signs    Vitals:   09-19-18 0724 09/19/18 0730 09-19-18 0800 September 19, 2018 0900  BP:  (!) 80/59 (!) 82/65 (!) 73/53  Pulse:      Resp:  (!) 30 (!) 30 (!) 28  Temp:   98.3 F (36.8 C)   TempSrc:   Axillary   SpO2: 96%  96%   Weight:      Height:        Intake/Output Summary (Last 24 hours) at 19-Sep-2018 1136 Last data filed at 09/19/18 0800 Gross per 24 hour  Intake 2855.28 ml  Output 260 ml  Net 2595.28 ml   Filed Weights   09/20/2018 0931 09/01/2018 1233  Weight: 72.6 kg 76.6 kg    Telemetry    Atrial flutter with 2:1 conduction - Personally Reviewed  Physical Exam   GEN- The patient is very ill appearing.  Head- normocephalic, atraumatic Eyes-  Sclera clear, conjunctiva pink Ears- hearing intact Oropharynx- clear Neck- supple,  Trach, now back on  vent Lungs- Coarse BS Heart-tachycardic irregular rhythm  GI- soft, NT, ND, + BS Extremities- no clubbing, cyanosis, or edema  MS- diffuse atrophy Skin- diffuse ecchymosis Psych- unable to assess Neuro- unable to assess   Labs    Chemistry Recent Labs  Lab 09/29/2018 0821 09/21/2018 2122 09/10/18 0340 19-Sep-2018 0433  NA 137 136 138 140  K 4.1 4.2 4.3 4.5  CL 98 108 106 108  CO2 23 17* 17* 20*  GLUCOSE 222* 138* 159* 135*  BUN 22 26* 30* 50*  CREATININE 2.14* 1.71* 2.00* 3.18*  CALCIUM 9.2 7.8* 7.8* 7.6*  PROT 8.2*  --   --   --   ALBUMIN 4.0  --   --   --   AST 140*  --   --   --   ALT 40  --   --   --   ALKPHOS 70  --   --   --   BILITOT 0.8  --   --   --   GFRNONAA 28* 37* 30* 17*  GFRAA 33* 43* 35* 20*  ANIONGAP 16* 11 15 12      Hematology Recent Labs  Lab 09/13/2018 0821 09/10/18 0340 2018/09/19 0433  WBC 12.6* 11.6* 11.4*  RBC 4.25 4.19* 3.85*  HGB 13.9 13.5 12.6*  HCT 40.7 41.8 37.1*  MCV 95.8 99.8 96.4  MCH 32.7 32.2 32.7  MCHC 34.2 32.3 34.0  RDW 12.3 12.6 12.9  PLT 172 155 160    Cardiac Enzymes Recent Labs  Lab 09/22/2018 0821 09/04/2018 1252 09/22/2018 1905 09/10/18 0038  TROPONINI 8.03* 23.15* 23.42* 27.07*   No results for input(s): TROPIPOC in the last 168 hours.     Echocardiogram was personally reviewed by me:  - Left ventricle: The cavity size was mildly dilated. Wall   thickness was normal. Systolic function was severely reduced. The   estimated ejection fraction was in the range of 10% to 15%.   Diffuse hypokinesis. - Aortic valve: There was moderate to severe stenosis. Mean   gradient (S): 14 mm Hg. Valve area (VTI): 0.77 cm^2. Valve   opening is severely restricted. Low flow-low gradient severe   aortic stenosis can not be excluded. - Mitral valve: Calcified annulus. There was moderate   regurgitation. - Left atrium: The atrium was moderately dilated. - Right ventricle: Systolic function was moderately reduced. - Right atrium: The  atrium was moderately dilated. - Inferior vena cava: The vessel was dilated. The respirophasic   diameter changes were blunted (< 50%), consistent with elevated   central venous pressure.    Assessment & Plan    1.  NSTEMI: Significant elevation in troponin.  Echo showed significant drop in ejection fraction to 10 to 15%.  The patient seems to be going into multiorgan failure with worsening renal function. Given his multiple comorbidities, he is not a good candidate for invasive evaluation.  2. Atrial flutter (Typical) with RVR On IV heparin On amiodarone for rate control Given the significant hypotension, and a normal situation urgent cardioversion would be advised.  However, given his overall poor prognosis, I am not certain this is going to make any difference in his clinical outcome.  3. Aortic stenosis This seems to be severe based on echocardiogram from yesterday.  He is not a candidate for surgery.  4.  Cardiogenic shock with multiorgan failure: Unfortunately, the patient has refractory hypotension.  Consider adding norepinephrine drip.  However, I advise a family meeting and discussion of CODE STATUS and overall goals of treatment.  I strongly feel that this patient's prognosis is extremely poor and unlikely to recover from current medical issues.  In addition, I suspect that he will develop advanced renal failure and he does not seem to be a good candidate for dialysis given his cardiac status. I recommend palliative care, addressing CODE STATUS and consider comfort care.  I discussed the case with nursing as well as Dr. Alva Garnet.  Total care time is greater than 35 minutes including discussion and coordination of care.  Kathlyn Sacramento,  MD, [facility] Sep 29, 2018 11:36 AM

## 2018-10-01 DEATH — deceased

## 2020-05-11 IMAGING — DX DG CHEST 1V PORT
1 series · 1 of 1 positions shown · non-contrast
Comparison: 09/10/2018 chest x-ray.

CLINICAL DATA: 77-year-old male with acute respiratory failure.
Subsequent encounter.

EXAM:
PORTABLE CHEST 1 VIEW

[chest ap]
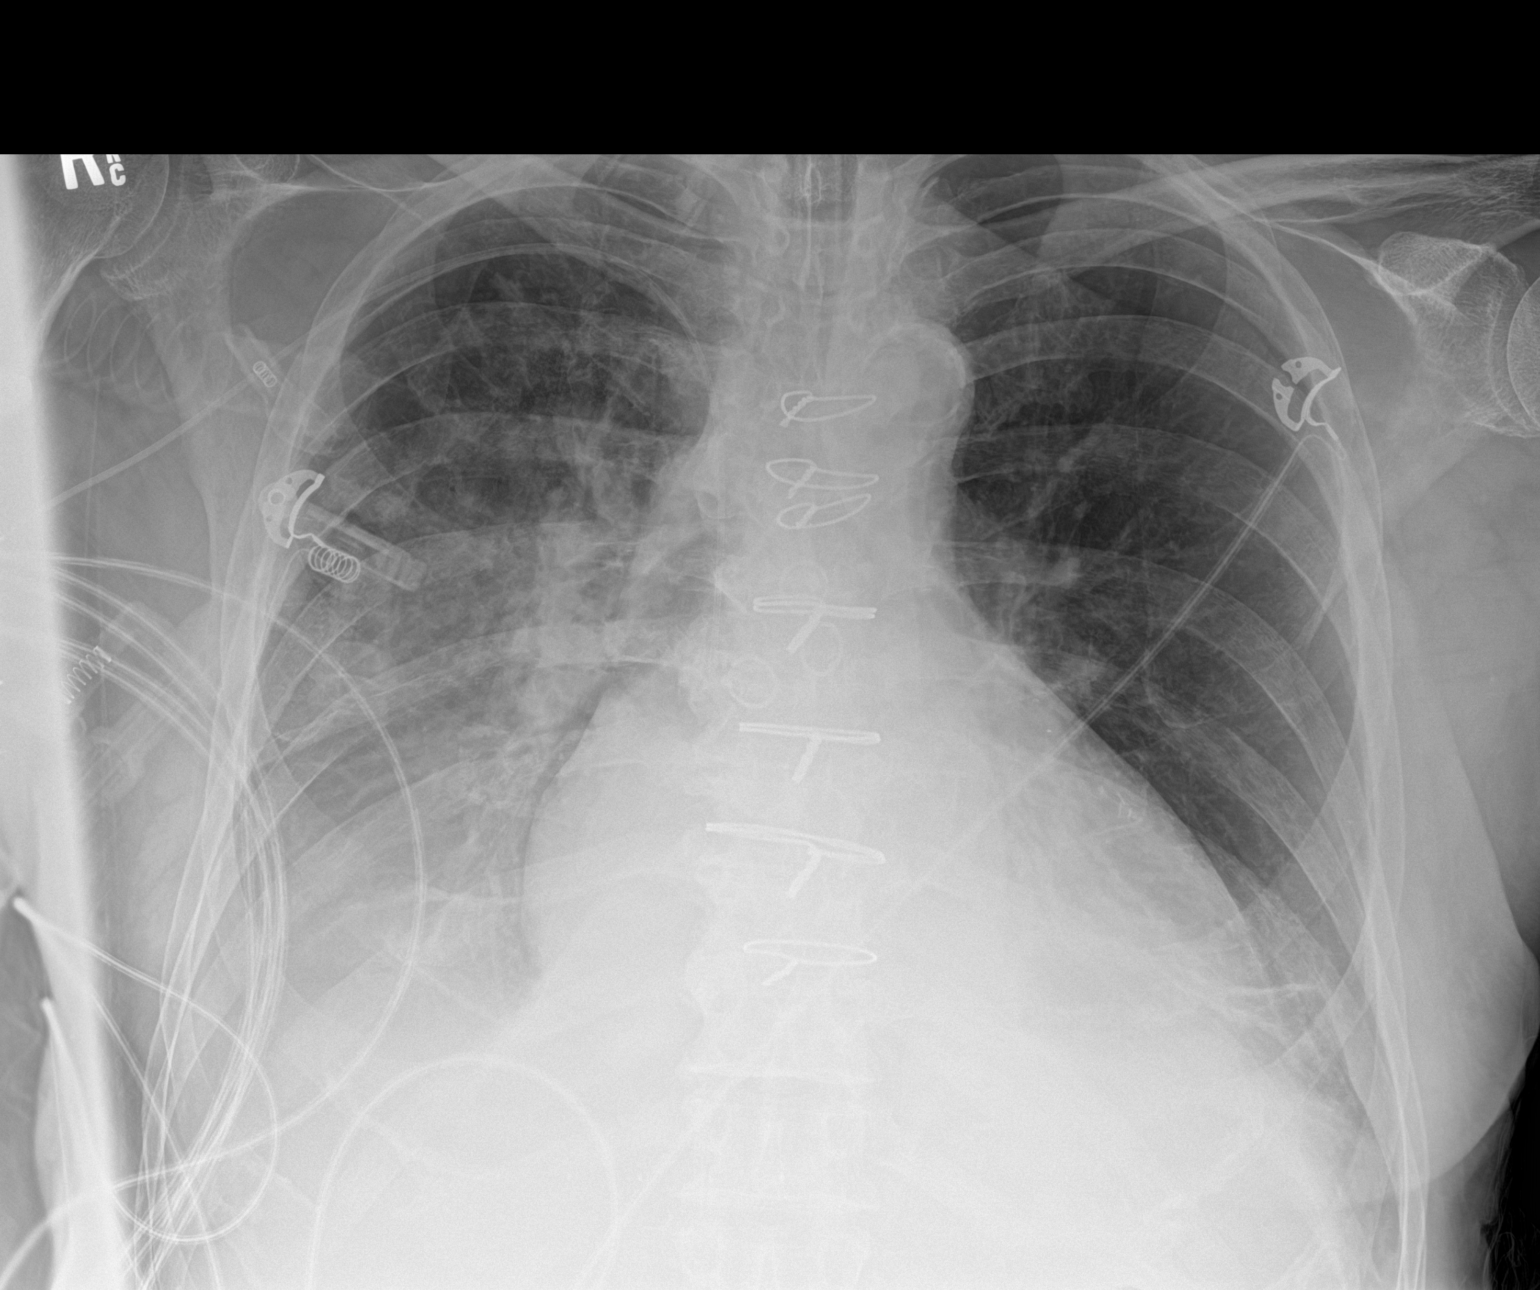

[1 of 1 positions shown; findings below may reference images not displayed]

FINDINGS: Tracheostomy tube tip midline 5.8 cm above the carina. Right central
line tip mid superior vena cava level. Post CABG. Cardiomegaly.

Progressive consolidation right mid to lower thorax suggestive of
progressive right lower lobe. Possible increase in size of
right-sided pleural effusion. Asymmetric pulmonary vascular
congestion/mild pulmonary edema. Left base subsegmental atelectasis.
No pneumothorax noted. Calcified aorta.
IMPRESSION: 1. Progressive consolidation right lower lobe raising possibility of
infiltrate. Possible increase in size of right-sided pleural
effusion.
2. Asymmetric pulmonary vascular congestion/mild pulmonary edema.
3. Left base subsegmental atelectasis.
4. Cardiomegaly post CABG.
5.  Aortic Atherosclerosis ([id number]).
